# Patient Record
Sex: Female | Born: 1960 | Race: Black or African American | Hispanic: No | Marital: Single | Smoking: Former smoker
Health system: Southern US, Community
[De-identification: ages and names within clinical notes are randomized; demographics above are authoritative.]

## PROBLEM LIST (undated history)

## (undated) DIAGNOSIS — E7800 Pure hypercholesterolemia, unspecified: Secondary | ICD-10-CM

## (undated) DIAGNOSIS — E119 Type 2 diabetes mellitus without complications: Secondary | ICD-10-CM

## (undated) DIAGNOSIS — R0602 Shortness of breath: Secondary | ICD-10-CM

## (undated) DIAGNOSIS — R079 Chest pain, unspecified: Secondary | ICD-10-CM

## (undated) DIAGNOSIS — I10 Essential (primary) hypertension: Secondary | ICD-10-CM

## (undated) HISTORY — DX: Shortness of breath: R06.02

## (undated) HISTORY — PX: TUBAL LIGATION: SHX77

## (undated) HISTORY — DX: Chest pain, unspecified: R07.9

## (undated) HISTORY — PX: HERNIA REPAIR: SHX51

## (undated) HISTORY — PX: CORONARY ANGIOPLASTY WITH STENT PLACEMENT: SHX49

---

## 2010-09-05 DEATH — deceased

## 2017-03-24 ENCOUNTER — Encounter (HOSPITAL_BASED_OUTPATIENT_CLINIC_OR_DEPARTMENT_OTHER): Payer: Self-pay | Admitting: Emergency Medicine

## 2017-03-24 ENCOUNTER — Observation Stay (HOSPITAL_BASED_OUTPATIENT_CLINIC_OR_DEPARTMENT_OTHER)
Admission: EM | Admit: 2017-03-24 | Discharge: 2017-03-26 | Disposition: A | Discharge status: Home / Self Care | Payer: Medicaid Other | Attending: Internal Medicine | Admitting: Internal Medicine

## 2017-03-24 ENCOUNTER — Emergency Department (HOSPITAL_BASED_OUTPATIENT_CLINIC_OR_DEPARTMENT_OTHER): Payer: Medicaid Other

## 2017-03-24 DIAGNOSIS — I10 Essential (primary) hypertension: Secondary | ICD-10-CM | POA: Diagnosis not present

## 2017-03-24 DIAGNOSIS — E7800 Pure hypercholesterolemia, unspecified: Secondary | ICD-10-CM | POA: Diagnosis not present

## 2017-03-24 DIAGNOSIS — Z79899 Other long term (current) drug therapy: Secondary | ICD-10-CM | POA: Diagnosis not present

## 2017-03-24 DIAGNOSIS — Z7902 Long term (current) use of antithrombotics/antiplatelets: Secondary | ICD-10-CM | POA: Diagnosis not present

## 2017-03-24 DIAGNOSIS — I2510 Atherosclerotic heart disease of native coronary artery without angina pectoris: Secondary | ICD-10-CM | POA: Diagnosis not present

## 2017-03-24 DIAGNOSIS — Z8249 Family history of ischemic heart disease and other diseases of the circulatory system: Secondary | ICD-10-CM | POA: Diagnosis not present

## 2017-03-24 DIAGNOSIS — I252 Old myocardial infarction: Secondary | ICD-10-CM | POA: Diagnosis not present

## 2017-03-24 DIAGNOSIS — Z7984 Long term (current) use of oral hypoglycemic drugs: Secondary | ICD-10-CM | POA: Insufficient documentation

## 2017-03-24 DIAGNOSIS — Z9861 Coronary angioplasty status: Secondary | ICD-10-CM

## 2017-03-24 DIAGNOSIS — I200 Unstable angina: Secondary | ICD-10-CM

## 2017-03-24 DIAGNOSIS — Z6841 Body Mass Index (BMI) 40.0 and over, adult: Secondary | ICD-10-CM | POA: Insufficient documentation

## 2017-03-24 DIAGNOSIS — E785 Hyperlipidemia, unspecified: Secondary | ICD-10-CM | POA: Diagnosis present

## 2017-03-24 DIAGNOSIS — R079 Chest pain, unspecified: Secondary | ICD-10-CM | POA: Diagnosis not present

## 2017-03-24 DIAGNOSIS — Z880 Allergy status to penicillin: Secondary | ICD-10-CM | POA: Insufficient documentation

## 2017-03-24 DIAGNOSIS — R911 Solitary pulmonary nodule: Secondary | ICD-10-CM | POA: Diagnosis not present

## 2017-03-24 DIAGNOSIS — Z87891 Personal history of nicotine dependence: Secondary | ICD-10-CM | POA: Diagnosis not present

## 2017-03-24 DIAGNOSIS — Z955 Presence of coronary angioplasty implant and graft: Secondary | ICD-10-CM | POA: Insufficient documentation

## 2017-03-24 DIAGNOSIS — E119 Type 2 diabetes mellitus without complications: Secondary | ICD-10-CM

## 2017-03-24 DIAGNOSIS — K219 Gastro-esophageal reflux disease without esophagitis: Secondary | ICD-10-CM

## 2017-03-24 DIAGNOSIS — Z833 Family history of diabetes mellitus: Secondary | ICD-10-CM | POA: Insufficient documentation

## 2017-03-24 HISTORY — DX: Type 2 diabetes mellitus without complications: E11.9

## 2017-03-24 HISTORY — DX: Essential (primary) hypertension: I10

## 2017-03-24 HISTORY — DX: Pure hypercholesterolemia, unspecified: E78.00

## 2017-03-24 LAB — CBC
HCT: 27.6 % — ABNORMAL LOW (ref 36.0–46.0)
Hemoglobin: 9.3 g/dL — ABNORMAL LOW (ref 12.0–15.0)
MCH: 28.1 pg (ref 26.0–34.0)
MCHC: 33.7 g/dL (ref 30.0–36.0)
MCV: 83.4 fL (ref 78.0–100.0)
Platelets: 289 10*3/uL (ref 150–400)
RBC: 3.31 MIL/uL — ABNORMAL LOW (ref 3.87–5.11)
RDW: 15 % (ref 11.5–15.5)
WBC: 6.2 10*3/uL (ref 4.0–10.5)

## 2017-03-24 LAB — BASIC METABOLIC PANEL
Anion gap: 7 (ref 5–15)
BUN: 27 mg/dL — ABNORMAL HIGH (ref 6–20)
CO2: 25 mmol/L (ref 22–32)
Calcium: 9.1 mg/dL (ref 8.9–10.3)
Chloride: 106 mmol/L (ref 101–111)
Creatinine, Ser: 1.44 mg/dL — ABNORMAL HIGH (ref 0.44–1.00)
GFR calc Af Amer: 46 mL/min — ABNORMAL LOW (ref >60)
GFR calc non Af Amer: 40 mL/min — ABNORMAL LOW (ref >60)
Glucose, Bld: 81 mg/dL (ref 65–99)
Potassium: 3.7 mmol/L (ref 3.5–5.1)
Sodium: 138 mmol/L (ref 135–145)

## 2017-03-24 LAB — TROPONIN I: Troponin I: <0.03 ng/mL (ref <0.03)

## 2017-03-24 LAB — GLUCOSE, CAPILLARY: Glucose-Capillary: 137 mg/dL — ABNORMAL HIGH (ref 65–99)

## 2017-03-24 LAB — BRAIN NATRIURETIC PEPTIDE: B Natriuretic Peptide: 27.6 pg/mL (ref 0.0–100.0)

## 2017-03-24 MED ORDER — ONDANSETRON HCL 4 MG/2ML IJ SOLN
4.0000 mg | Freq: Four times a day (QID) | INTRAMUSCULAR | Status: DC | PRN
  Administered 2017-03-26: 4 mg via INTRAVENOUS
  Filled 2017-03-24: qty 2

## 2017-03-24 MED ORDER — AMLODIPINE BESYLATE 10 MG PO TABS
10.0000 mg | ORAL_TABLET | Freq: Every day | ORAL | Status: DC
  Administered 2017-03-25 – 2017-03-26 (×2): 10 mg via ORAL
  Filled 2017-03-24 (×2): qty 1

## 2017-03-24 MED ORDER — FAMOTIDINE 20 MG PO TABS
20.0000 mg | ORAL_TABLET | Freq: Two times a day (BID) | ORAL | Status: DC
  Administered 2017-03-25 – 2017-03-26 (×4): 20 mg via ORAL
  Filled 2017-03-24 (×4): qty 1

## 2017-03-24 MED ORDER — SIMVASTATIN 10 MG PO TABS
10.0000 mg | ORAL_TABLET | Freq: Every day | ORAL | Status: DC
  Administered 2017-03-25: 10 mg via ORAL
  Filled 2017-03-24 (×2): qty 1

## 2017-03-24 MED ORDER — ENOXAPARIN SODIUM 40 MG/0.4ML ~~LOC~~ SOLN
40.0000 mg | Freq: Every day | SUBCUTANEOUS | Status: DC
  Filled 2017-03-24: qty 0.4

## 2017-03-24 MED ORDER — INSULIN ASPART 100 UNIT/ML ~~LOC~~ SOLN
0.0000 [IU] | SUBCUTANEOUS | Status: DC
  Administered 2017-03-25: 1 [IU] via SUBCUTANEOUS

## 2017-03-24 MED ORDER — ENALAPRIL-HYDROCHLOROTHIAZIDE 10-25 MG PO TABS: 1.0000 | ORAL_TABLET | Freq: Every day | ORAL | Status: DC

## 2017-03-24 MED ORDER — ASPIRIN 81 MG PO CHEW
324.0000 mg | CHEWABLE_TABLET | Freq: Once | ORAL | Status: AC
  Administered 2017-03-24: 324 mg via ORAL
  Filled 2017-03-24: qty 4

## 2017-03-24 MED ORDER — CLOPIDOGREL BISULFATE 75 MG PO TABS
75.0000 mg | ORAL_TABLET | Freq: Every day | ORAL | Status: DC
  Administered 2017-03-25 – 2017-03-26 (×2): 75 mg via ORAL
  Filled 2017-03-24 (×2): qty 1

## 2017-03-24 MED ORDER — ACETAMINOPHEN 325 MG PO TABS
650.0000 mg | ORAL_TABLET | ORAL | Status: DC | PRN
  Administered 2017-03-25 (×2): 650 mg via ORAL
  Filled 2017-03-24 (×2): qty 2

## 2017-03-24 MED ORDER — NITROGLYCERIN 2 % TD OINT
1.0000 [in_us] | TOPICAL_OINTMENT | Freq: Once | TRANSDERMAL | Status: AC
  Administered 2017-03-24: 1 [in_us] via TOPICAL
  Filled 2017-03-24: qty 1

## 2017-03-24 NOTE — ED Provider Notes (Signed)
[facility] Provider Note   CSN: [id number] Arrival date & time: 03/24/17  1537     History   Chief Complaint Chief Complaint  Patient presents with  . Chest Pain    HPI Ruth Ortega is a 56 y.o. female.  HPI 56 year old female with a history of hypertension, hyperlipidemia, diabetes, prior MI status post stenting who presents to the emergency department for substernal, sharp chest pain that is exertional in nature, radiating to the left arm and associated with shortness of breath, and in nausea. This pain is similar to patient's prior heart attack. Has not tried taking nitroglycerin. However her patient has slowly improved since arriving to the ED. She reports having a respiratory infection about a week ago. However that has improved. Also endorses several months of right lateral lower extremity edema. Also endorsing dyspnea on exertion. Denies any prior history of DVT/PE, prolonged immobility, hormone replacement therapy.  No other alleviating or aggravating factors. Denies any other physical complaints.  Past Medical History:  Diagnosis Date  . Diabetes mellitus without complication (HCC)   . High cholesterol   . Hypertension     There are no active problems to display for this patient.   Past Surgical History:  Procedure Laterality Date  . CORONARY ANGIOPLASTY WITH STENT PLACEMENT    . HERNIA REPAIR    . TUBAL LIGATION      OB History    No data available       Home Medications    Prior to Admission medications   Medication Sig Start Date End Date Taking? Authorizing Provider  amLODipine (NORVASC) 10 MG tablet Take 10 mg by mouth daily.   Yes [provider]  clopidogrel (PLAVIX) 75 MG tablet Take 75 mg by mouth daily.   Yes [provider]  enalapril-hydrochlorothiazide (VASERETIC) 10-25 MG tablet Take 1 tablet by mouth daily.   Yes [provider]  famotidine (PEPCID) 20 MG tablet Take 20 mg by mouth 2 (two) times  daily.   Yes [provider]  metFORMIN (GLUCOPHAGE) 500 MG tablet Take 500 mg by mouth 2 (two) times daily with a meal.   Yes [provider]  repaglinide (PRANDIN) 2 MG tablet Take 2 mg by mouth 3 (three) times daily before meals.   Yes [provider]  simvastatin (ZOCOR) 10 MG tablet Take 10 mg by mouth daily.   Yes [provider]    Family History No family history on file.  Social History Social History  Substance Use Topics  . Smoking status: Former Games developer  . Smokeless tobacco: Never Used  . Alcohol use No     Allergies   Fiorinal [butalbital-aspirin-caffeine]; Latex; and Penicillins   Review of Systems Review of Systems All other systems are reviewed and are negative for acute change except as noted in the HPI   Physical Exam Updated Vital Signs BP (!) 154/77   Pulse 79   Temp 98.5 F (36.9 C) (Oral)   Resp 17   Ht  (1.549 m)   Wt 71.7 kg (158 lb)   SpO2 100%   BMI 29.85 kg/m   Physical Exam  Constitutional: She is oriented to person, place, and time. She appears well-developed and well-nourished. No distress.  HENT:  Head: Normocephalic and atraumatic.  Nose: Nose normal.  Eyes: Pupils are equal, round, and reactive to light. Conjunctivae and EOM are normal. Right eye exhibits no discharge. Left eye exhibits no discharge. No scleral icterus.  Neck: Normal range  of motion. Neck supple.  Cardiovascular: Normal rate and regular rhythm.  Exam reveals no gallop and no friction rub.   No murmur heard. Pulmonary/Chest: Effort normal and breath sounds normal. No stridor. No respiratory distress. She has no rales.  Abdominal: Soft. She exhibits no distension. There is no tenderness.  Musculoskeletal: She exhibits no edema or tenderness.  2+ bilateral lower extremity edema  Neurological: She is alert and oriented to person, place, and time.  Skin: Skin is warm and dry. No rash noted. She is not diaphoretic. No erythema.    Psychiatric: She has a normal mood and affect.  Vitals reviewed.    ED Treatments / Results  Labs (all labs ordered are listed, but only abnormal results are displayed) Labs Reviewed  BASIC METABOLIC PANEL - Abnormal; Notable for the following:       Result Value   BUN 27 (*)    Creatinine, Ser 1.44 (*)    GFR calc non Af Amer 40 (*)    GFR calc Af Amer 46 (*)    All other components within normal limits  CBC - Abnormal; Notable for the following:    RBC 3.31 (*)    Hemoglobin 9.3 (*)    HCT 27.6 (*)    All other components within normal limits  TROPONIN I  BRAIN NATRIURETIC PEPTIDE    EKG  EKG Interpretation  Date/Time:  Tuesday March 24 2017 15:56:49 EDT Ventricular Rate:  69 PR Interval:    QRS Duration: 86 QT Interval:  384 QTC Calculation: 412 R Axis:   45 Text Interpretation:  Sinus rhythm Borderline prolonged PR interval NO STEMI No old tracing to compare Confirmed by Drema Pry [phone]) on 03/24/2017 3:59:55 PM       Radiology Dg Chest 2 View  Result Date: 03/24/2017 CLINICAL DATA:  Chest pain with cardiac palpitations EXAM: CHEST  2 VIEW COMPARISON:  None. FINDINGS: On the lateral view, there is a 1.4 x 1.7 cm nodular opacity anterior to the T11-12 area. This nodular opacity is not seen on the frontal view. No edema or consolidation. Heart size and pulmonary vascularity are normal. No adenopathy. There is degenerative change in the thoracic spine. IMPRESSION: 1.4 x 1.7 cm nodular opacity common lower thoracic region anterior to T11-12 seen on lateral view only. This finding warrants noncontrast enhanced chest CT to further evaluate. No edema or consolidation. Cardiac silhouette within normal limits.  No evident adenopathy. Electronically Signed   By: Bretta Bang III M.D.   On: 03/24/2017 16:21    Procedures Procedures (including critical care time)  Medications Ordered in ED Medications - No data to display   Initial Impression / Assessment  and Plan / ED Course  I have reviewed the triage vital signs and the nursing notes.  Pertinent labs & imaging results that were available during my care of the patient were reviewed by me and considered in my medical decision making (see chart for details).     Typical chest pain for patient's ACS pain. EKG nonischemic. No prior EKG for comparison. Initial troponin negative. Patient is high risk for ACS and will require admission for ACS rule out.  Low pretest probability for pulmonary embolism. Presentation is classic for aortic dissection or esophageal perforation.  Chest x-ray without evidence suggestive of pneumonia, pneumothorax, pneumomediastinum.  No abnormal contour of the mediastinum to suggest dissection. No evidence of acute injuries. Patient had incidental finding which she was made aware of.  BMP within normal limits, not consistent  with heart failure.  Case discussed with Dr. Ophelia Charter who admitted the patient for ACS rule out.   Final Clinical Impressions(s) / ED Diagnoses   Final diagnoses:  Unstable angina (HCC)      Jaxtin Raimondo, Amadeo Garnet, MD 03/24/17 2323

## 2017-03-24 NOTE — Progress Notes (Signed)
Called with request for transfer for patient from [facility].  Patient is a 55yo with h/o HTN, HLD, DM, and CAD s/p stent placement presenting to [facility] for c/o chest pain with radiation to left shoulder and arm.   She is visiting from [place], visiting her daughter.  Associated symptoms: SOB, nausea.  +exertional.  EKG unremarkable.  Troponin negative x 1.  +LE edema, some DOE, normal BNP.  CXR negative other than a lung nodule that will need outpatient follow-up.  Needs transfer for ACS r/o.  Georgana Curio, M.D.

## 2017-03-24 NOTE — ED Notes (Signed)
Pt has been given chicken parmesan frozen dinner and 2x ginger ale to drink, ok per [employer]

## 2017-03-24 NOTE — ED Notes (Addendum)
Pt reports dizziness upon ambulating, feeling like "a black wave" is coming over here. Pt given wheelchair and assisted to bathroom.

## 2017-03-24 NOTE — ED Triage Notes (Signed)
Chest pain on left side radiating in to left shoulder and arm.

## 2017-03-24 NOTE — H&P (Signed)
History and Physical    Jaina Morin QMV:[id number] DOB: 1960-08-22 DOA: 03/24/2017  PCP: Patient, No Pcp Per  Patient coming from: Home  I have personally briefly reviewed patient's old medical records in [facility]  Chief Complaint: Chest pain  HPI: Lissete Maestas is a 56 y.o. female with medical history significant of CAD s/p stent some 2-3 years ago, DM2, HTN, HLD.  Patient presents to the ED at [facility] with c/o substernal, sharp, chest pain.  Worse with exertion and better at rest.  Radiation to L arm.  Associated SOB and nausea.  Similar to prior MI.  Symptoms improved during ED stay.   ED Course: Low prestest prob of DVT/PE.  CXR neg, trop neg, EKG non-ischemic.   Review of Systems: As per HPI otherwise 10 point review of systems negative.   Past Medical History:  Diagnosis Date  . Diabetes mellitus without complication (HCC)   . High cholesterol   . Hypertension     Past Surgical History:  Procedure Laterality Date  . CORONARY ANGIOPLASTY WITH STENT PLACEMENT    . HERNIA REPAIR    . TUBAL LIGATION       reports that she has quit smoking. She has never used smokeless tobacco. She reports that she does not drink alcohol or use drugs.  Allergies  Allergen Reactions  . Fiorinal [Butalbital-Aspirin-Caffeine]   . Latex   . Penicillins     No family history on file.   Prior to Admission medications   Medication Sig Start Date End Date Taking? Authorizing Provider  amLODipine (NORVASC) 10 MG tablet Take 10 mg by mouth daily.   Yes [provider]  clopidogrel (PLAVIX) 75 MG tablet Take 75 mg by mouth daily.   Yes [provider]  enalapril-hydrochlorothiazide (VASERETIC) 10-25 MG tablet Take 1 tablet by mouth daily.   Yes [provider]  famotidine (PEPCID) 20 MG tablet Take 20 mg by mouth 2 (two) times daily.   Yes [provider]  metFORMIN (GLUCOPHAGE) 500 MG tablet Take 500 mg by mouth 2 (two) times daily with a meal.   Yes  [provider]  repaglinide (PRANDIN) 2 MG tablet Take 2 mg by mouth 3 (three) times daily before meals.   Yes [provider]  simvastatin (ZOCOR) 10 MG tablet Take 10 mg by mouth daily.   Yes [provider]    Physical Exam: Vitals:   03/24/17 2100 03/24/17 2130 03/24/17 2200 03/24/17 2313  BP: (!) 146/73 137/82 (!) 143/68 (!) 152/71  Pulse: 78 78  78  Resp: 16 (!) Temp:    97.8 F (36.6 C)  TempSrc:    Oral  SpO2: 100% 100%  100%  Weight:    121.5 kg (267 lb 14.4 oz)  Height:     (1.651 m)    Constitutional: NAD, calm, comfortable Eyes: PERRL, lids and conjunctivae normal ENMT: Mucous membranes are moist. Posterior pharynx clear of any exudate or lesions.Normal dentition.  Neck: normal, supple, no masses, no thyromegaly Respiratory: clear to auscultation bilaterally, no wheezing, no crackles. Normal respiratory effort. No accessory muscle use.  Cardiovascular: Regular rate and rhythm, no murmurs / rubs / gallops. No extremity edema. 2+ pedal pulses. No carotid bruits.  Abdomen: no tenderness, no masses palpated. No hepatosplenomegaly. Bowel sounds positive.  Musculoskeletal: no clubbing / cyanosis. No joint deformity upper and lower extremities. Good ROM, no contractures. Normal muscle tone.  Skin: no rashes, lesions, ulcers. No induration Neurologic:  CN 2-12 grossly intact. Sensation intact, DTR normal. Strength 5/5 in all 4.  Psychiatric: Normal judgment and insight. Alert and oriented x 3. Normal mood.    Labs on Admission: I have personally reviewed following labs and imaging studies  CBC:  Recent Labs Lab 03/24/17 1600  WBC 6.2  HGB 9.3*  HCT 27.6*  MCV 83.4  PLT 289   Basic Metabolic Panel:  Recent Labs Lab 03/24/17 1600  NA 138  K 3.7  CL 106  CO2 25  GLUCOSE 81  BUN 27*  CREATININE 1.44*  CALCIUM 9.1   GFR: Estimated Creatinine Clearance: 57.7 mL/min (A) (by C-G formula based on SCr of 1.44 mg/dL  (H)). Liver Function Tests: No results for input(s): AST, ALT, ALKPHOS, BILITOT, PROT, ALBUMIN in the last 168 hours. No results for input(s): LIPASE, AMYLASE in the last 168 hours. No results for input(s): AMMONIA in the last 168 hours. Coagulation Profile: No results for input(s): INR, PROTIME in the last 168 hours. Cardiac Enzymes:  Recent Labs Lab 03/24/17 1600  TROPONINI <0.03   BNP (last 3 results) No results for input(s): PROBNP in the last 8760 hours. HbA1C: No results for input(s): HGBA1C in the last 72 hours. CBG:  Recent Labs Lab 03/24/17 2328  GLUCAP 137*   Lipid Profile: No results for input(s): CHOL, HDL, LDLCALC, TRIG, CHOLHDL, LDLDIRECT in the last 72 hours. Thyroid Function Tests: No results for input(s): TSH, T4TOTAL, FREET4, T3FREE, THYROIDAB in the last 72 hours. Anemia Panel: No results for input(s): VITAMINB12, FOLATE, FERRITIN, TIBC, IRON, RETICCTPCT in the last 72 hours. Urine analysis: No results found for: COLORURINE, APPEARANCEUR, LABSPEC, PHURINE, GLUCOSEU, HGBUR, BILIRUBINUR, KETONESUR, PROTEINUR, UROBILINOGEN, NITRITE, LEUKOCYTESUR  Radiological Exams on Admission: Dg Chest 2 View  Result Date: 03/24/2017 CLINICAL DATA:  Chest pain with cardiac palpitations EXAM: CHEST  2 VIEW COMPARISON:  None. FINDINGS: On the lateral view, there is a 1.4 x 1.7 cm nodular opacity anterior to the T11-12 area. This nodular opacity is not seen on the frontal view. No edema or consolidation. Heart size and pulmonary vascularity are normal. No adenopathy. There is degenerative change in the thoracic spine. IMPRESSION: 1.4 x 1.7 cm nodular opacity common lower thoracic region anterior to T11-12 seen on lateral view only. This finding warrants noncontrast enhanced chest CT to further evaluate. No edema or consolidation. Cardiac silhouette within normal limits.  No evident adenopathy. Electronically Signed   By: Bretta Bang III M.D.   On: 03/24/2017 16:21    EKG:  Independently reviewed.  Assessment/Plan Principal Problem:   Chest pain Active Problems:   DM2 (diabetes mellitus, type 2) (HCC)   HTN (hypertension)    1. CP rule out - 1. CP obs pathway 2. Serial trops 3. NPO after midnight 4. Cards eval in AM 5. Tele monitor 2. DM2 - 1. Holding home PO hypoglycemics 2. Sensitive SSI Q4H 3. HTN - 1. Continue home BP meds  DVT prophylaxis: Lovenox Code Status: Full Family Communication: No family in room Disposition Plan: Home after admit Consults called: Left message for P. Trent for routine cards consult in AM Admission status: Place in [place], [place]. DO [facility] Pager [phone]  If 7AM-7PM, please contact day team taking care of patient [email or web address] Password [facility]  03/24/2017, 11:48 PM

## 2017-03-24 NOTE — ED Notes (Signed)
Pt resting comfortably on the stretcher in NAD. Pt not having any active chest pain at this time.

## 2017-03-25 ENCOUNTER — Encounter (HOSPITAL_COMMUNITY): Payer: Self-pay | Admitting: Cardiology

## 2017-03-25 ENCOUNTER — Observation Stay (HOSPITAL_BASED_OUTPATIENT_CLINIC_OR_DEPARTMENT_OTHER): Payer: Medicaid Other

## 2017-03-25 DIAGNOSIS — R079 Chest pain, unspecified: Secondary | ICD-10-CM

## 2017-03-25 DIAGNOSIS — I2510 Atherosclerotic heart disease of native coronary artery without angina pectoris: Secondary | ICD-10-CM | POA: Diagnosis not present

## 2017-03-25 DIAGNOSIS — E785 Hyperlipidemia, unspecified: Secondary | ICD-10-CM

## 2017-03-25 DIAGNOSIS — K219 Gastro-esophageal reflux disease without esophagitis: Secondary | ICD-10-CM

## 2017-03-25 DIAGNOSIS — Z9861 Coronary angioplasty status: Secondary | ICD-10-CM

## 2017-03-25 DIAGNOSIS — E119 Type 2 diabetes mellitus without complications: Secondary | ICD-10-CM | POA: Diagnosis not present

## 2017-03-25 DIAGNOSIS — I10 Essential (primary) hypertension: Secondary | ICD-10-CM | POA: Diagnosis not present

## 2017-03-25 DIAGNOSIS — I252 Old myocardial infarction: Secondary | ICD-10-CM | POA: Diagnosis not present

## 2017-03-25 LAB — GLUCOSE, CAPILLARY
Glucose-Capillary: 149 mg/dL — ABNORMAL HIGH (ref 65–99)
Glucose-Capillary: 129 mg/dL — ABNORMAL HIGH (ref 65–99)
Glucose-Capillary: 206 mg/dL — ABNORMAL HIGH (ref 65–99)
Glucose-Capillary: 146 mg/dL — ABNORMAL HIGH (ref 65–99)

## 2017-03-25 LAB — HEMOGLOBIN A1C
Hgb A1c MFr Bld: 6 % — ABNORMAL HIGH (ref 4.8–5.6)
Mean Plasma Glucose: 125.5 mg/dL

## 2017-03-25 LAB — TROPONIN I
Troponin I: <0.03 ng/mL (ref <0.03)
Troponin I: <0.03 ng/mL (ref <0.03)
Troponin I: <0.03 ng/mL (ref <0.03)

## 2017-03-25 LAB — HIV ANTIBODY (ROUTINE TESTING W REFLEX): HIV Screen 4th Generation wRfx: NONREACTIVE

## 2017-03-25 MED ORDER — ENALAPRIL MALEATE 10 MG PO TABS
10.0000 mg | ORAL_TABLET | Freq: Every day | ORAL | Status: DC
  Administered 2017-03-25 – 2017-03-26 (×2): 10 mg via ORAL
  Filled 2017-03-25 (×2): qty 1

## 2017-03-25 MED ORDER — HYDROCHLOROTHIAZIDE 25 MG PO TABS
25.0000 mg | ORAL_TABLET | Freq: Every day | ORAL | Status: DC
  Administered 2017-03-25 – 2017-03-26 (×2): 25 mg via ORAL
  Filled 2017-03-25 (×2): qty 1

## 2017-03-25 MED ORDER — REGADENOSON 0.4 MG/5ML IV SOLN
INTRAVENOUS | Status: AC
  Filled 2017-03-25: qty 5

## 2017-03-25 MED ORDER — REGADENOSON 0.4 MG/5ML IV SOLN
0.4000 mg | Freq: Once | INTRAVENOUS | Status: AC
Start: 2017-03-25 — End: 2017-03-25
  Administered 2017-03-25: 0.4 mg via INTRAVENOUS
  Filled 2017-03-25: qty 5

## 2017-03-25 MED ORDER — TECHNETIUM TC 99M TETROFOSMIN IV KIT
30.0000 | PACK | Freq: Once | INTRAVENOUS | Status: AC | PRN
  Administered 2017-03-25: 30 via INTRAVENOUS

## 2017-03-25 MED ORDER — INSULIN ASPART 100 UNIT/ML ~~LOC~~ SOLN
0.0000 [IU] | Freq: Three times a day (TID) | SUBCUTANEOUS | Status: DC
  Administered 2017-03-26: 1 [IU] via SUBCUTANEOUS
  Administered 2017-03-26: 2 [IU] via SUBCUTANEOUS

## 2017-03-25 NOTE — Progress Notes (Signed)
[facility] PROGRESS NOTE  Ruth Ortega [id number] FournierOB: 11-14-1960 DOA: 03/24/2017 PCP: Patient, No Pcp Per  Interim summary and HPI 56 y.o. female with medical history significant of CAD s/p stent some 2-3 years ago, DM2, HTN, HLD.  Patient presents to the ED at [facility] with c/o substernal, sharp, chest pain.  Worse with exertion and better at rest.  Radiation to L arm.  Associated SOB and nausea.  Assessment/Plan: 1-chest pain -with typical and atypical features  -heart score 5 -troponin neg X 3 -case discussed with cardiology and will plan for inpatient stress test; if low probability can go home and follow up with her cardiologist; otherwise will require heart cath. -CP free currently -will continue ASA, zocor, Plavix and ACE -might benefit of stopping norvasc and starting b-blocker  2-GERD -will continue Pepcid  3-HTN -BP stable overall  -will continue current antihypertensive regimen   4-obesity -Body mass index is 44.23 kg/m. -low calorie diet and weight loss discussed with patient   5-HLD -continue statins  6-diabetes type 2 -will use SSI and hold oral hypoglycemic regimen while inpatient  -follow CBG's and adjust as needed; base on fluctuation might need lantus. -will check A1C  7-Chest nodular opacity  -1.4X1.7 cm nodular opacity common lower thoracic region anterior to T11-T12 on lateral view only. -patient with remote hx of smoking and denying second hand smoking -reported recent URI -after discussing need for CT scan; patient decided to pursuit that work up back home.  Code Status: Full Family Communication: no family at bedside  Disposition Plan: home when medically stable. Completing 2 days stress test to determine needs of further intervention    Consultants:  Cardiology   Procedures:  Nuclear stress test   Antibiotics:  None  HPI/Subjective: Afebrile, no nausea, no vomiting and not abd pain. Patient currently CP free and no  SOB.  Objective: Vitals:   03/25/17 1326 03/25/17 1635  BP: (!) 165/72 (!) 185/76  Pulse: 73 76  Resp: 17 16  Temp: 97.6 F (36.4 C) (!) 96.4 F (35.8 C)  SpO2: 100% 100%    Intake/Output Summary (Last 24 hours) at 03/25/17 1655 Last data filed at 03/25/17 1511  Gross per 24 hour  Intake              240 ml  Output             1500 ml  Net            -1260 ml   Filed Weights   03/24/17 1544 03/24/17 2313 03/25/17 0707  Weight: 71.7 kg (158 lb) 121.5 kg (267 lb 14.4 oz) 120.6 kg (265 lb 12.8 oz)    Exam:   General:  NAD, afebrile, no CP and denying SOB. No nausea, no vomiting.  Cardiovascular: S1 and s2, no rubs, no gallops  Respiratory: CTA bilaterally, no tachypnea and normal resp effort   Abdomen: soft, NT, ND, positive BS  Musculoskeletal: trace edema bilaterally, no cyanosis or clubbing    Data Reviewed: Basic Metabolic Panel:  Recent Labs Lab 03/24/17 1600  NA 138  K 3.7  CL 106  CO2 25  GLUCOSE 81  BUN 27*  CREATININE 1.44*  CALCIUM 9.1   Liver Function Tests: No results for input(s): AST, ALT, ALKPHOS, BILITOT, PROT, ALBUMIN in the last 168 hours. No results for input(s): LIPASE, AMYLASE in the last 168 hours. No results for input(s): AMMONIA in the last 168 hours. CBC:  Recent Labs Lab 03/24/17 1600  WBC 6.2  HGB 9.3*  HCT 27.6*  MCV 83.4  PLT 289   Cardiac Enzymes:  Recent Labs Lab 03/24/17 1600 03/24/17 2348 03/25/17 0217 03/25/17 0505  TROPONINI <0.03 <0.03 <0.03 <0.03   BNP (last 3 results)  Recent Labs  03/24/17 1600  BNP 27.6   CBG:  Recent Labs Lab 03/24/17 2328 03/25/17 0443 03/25/17 0741  GLUCAP 137* 149* 129*    Studies: Dg Chest 2 View  Result Date: 03/24/2017 CLINICAL DATA:  Chest pain with cardiac palpitations EXAM: CHEST  2 VIEW COMPARISON:  None. FINDINGS: On the lateral view, there is a 1.4 x 1.7 cm nodular opacity anterior to the T11-12 area. This nodular opacity is not seen on the frontal  view. No edema or consolidation. Heart size and pulmonary vascularity are normal. No adenopathy. There is degenerative change in the thoracic spine. IMPRESSION: 1.4 x 1.7 cm nodular opacity common lower thoracic region anterior to T11-12 seen on lateral view only. This finding warrants noncontrast enhanced chest CT to further evaluate. No edema or consolidation. Cardiac silhouette within normal limits.  No evident adenopathy. Electronically Signed   By: Bretta Bang III M.D.   On: 03/24/2017 16:21    Scheduled Meds: . amLODipine  10 mg Oral Daily  . clopidogrel  75 mg Oral Daily  . enalapril  10 mg Oral Daily   And  . hydrochlorothiazide  25 mg Oral Daily  . enoxaparin (LOVENOX) injection  40 mg Subcutaneous Daily  . famotidine  20 mg Oral BID  . insulin aspart  0-9 Units Subcutaneous Q4H  . simvastatin  10 mg Oral Daily   Continuous Infusions:  Principal Problem:   Chest pain with moderate risk of acute coronary syndrome Active Problems:   DM2 (diabetes mellitus, type 2) (HCC)   HTN (hypertension)   CAD S/P percutaneous coronary angioplasty   Dyslipidemia    Time spent: 30 minutes    Vassie Loll  [facility] Pager [phone]. If 7PM-7AM, please contact night-coverage at [email or web address], password [email or web address] 03/25/2017, 4:55 PM  LOS: 0 days

## 2017-03-25 NOTE — Progress Notes (Signed)
Stress Lexiscan done today. Final report pending- she is a two day study.  Corine Shelter PA-C 03/25/2017 12:13 PM

## 2017-03-25 NOTE — Progress Notes (Signed)
Paged MD regarding pt return from stress test, informed of results and requesting diet order

## 2017-03-25 NOTE — Progress Notes (Signed)
Pt states she is having "flutters" Pt states she informed MD this is how she felt on admission  Pt denies pain, and denies needing nitro Vital signs taken, EKG completed and placed in chart and placed pt on nasal canula Pt states she feels better  Pt transported to stress test

## 2017-03-25 NOTE — Progress Notes (Signed)
MD at bedside, stated okay for pt to have sips of water with all morning medications

## 2017-03-25 NOTE — Consult Note (Signed)
Cardiology Consultation:   Patient ID: Ruth Ortega; [id number]; 1960/10/26   Admit date: 03/24/2017 Date of Consult: 03/25/2017  Primary Care Provider: Patient, No Pcp Per Primary Cardiologist: primary cardiologist in [place]   Patient Profile:   Ruth Ortega is a 56 y.o. female with a hx of CAD s/p PCI in [place] who is being seen today for the evaluation of chest pain at the request of Dr Gwenlyn Perking.   History of Present Illness:   Ruth Ortega is a pleasant single 55 y/o female with a history of CAD-s/p PCI in [place] 3 years ago in the setting of "a small heart attack".  She had some recurrent chest pain after this and had what sounds like a chemical stress test a year ago that was OK as far as she knows. Apparently her symptoms were determined to be GI.   She is her helping her daughter with the children after her daughter had surgery. Yesterday she developed exertional weakness "things were going black", palpitations, as well as Lt sided chest pain, Lt arm pain, diaphoresis, and nausea. She was taken to the ED and admitted. Her EKG shows no acute changes and her Troponin are negative. She has had no further chest pain. Telemetry has been stable.  Past Medical History:  Diagnosis Date  . Diabetes mellitus without complication (HCC)   . High cholesterol   . Hypertension     Past Surgical History:  Procedure Laterality Date  . CORONARY ANGIOPLASTY WITH STENT PLACEMENT    . HERNIA REPAIR    . TUBAL LIGATION       Home Medications:  Prior to Admission medications   Medication Sig Start Date End Date Taking? Authorizing Provider  amLODipine (NORVASC) 10 MG tablet Take 10 mg by mouth daily.   Yes [provider]  clopidogrel (PLAVIX) 75 MG tablet Take 75 mg by mouth daily.   Yes [provider]  enalapril-hydrochlorothiazide (VASERETIC) 10-25 MG tablet Take 1 tablet by mouth daily.   Yes [provider]  famotidine (PEPCID) 20 MG tablet Take 20 mg by mouth 2 (two) times  daily.   Yes [provider]  metFORMIN (GLUCOPHAGE) 500 MG tablet Take 500 mg by mouth 2 (two) times daily with a meal.   Yes [provider]  repaglinide (PRANDIN) 2 MG tablet Take 2 mg by mouth 3 (three) times daily before meals.   Yes [provider]  simvastatin (ZOCOR) 10 MG tablet Take 10 mg by mouth daily.   Yes [provider]    Inpatient Medications: Scheduled Meds: . amLODipine  10 mg Oral Daily  . clopidogrel  75 mg Oral Daily  . enalapril-hydrochlorothiazide  1 tablet Oral Daily  . enoxaparin (LOVENOX) injection  40 mg Subcutaneous Daily  . famotidine  20 mg Oral BID  . insulin aspart  0-9 Units Subcutaneous Q4H  . simvastatin  10 mg Oral Daily   Continuous Infusions:  PRN Meds: acetaminophen, ondansetron (ZOFRAN) IV  Allergies:    Allergies  Allergen Reactions  . Fiorinal [Butalbital-Aspirin-Caffeine]   . Latex   . Penicillins     Social History:   Social History   Social History  . Marital status: Single    Spouse name: N/A  . Number of children: N/A  . Years of education: N/A   Occupational History  . Not on file.   Social History Main Topics  . Smoking status: Former Games developer  . Smokeless tobacco: Never Used  . Alcohol use No  .  Drug use: No  . Sexual activity: Not on file   Other Topics Concern  . Not on file   Social History Narrative  . No narrative on file    Family History:   M-F-Sister-DM and CAD  ROS:  Please see the history of present illness.  ROS  Transient visual defect OS in June that spontaneously resolved. She was evaluated by an opthalmologist and no obvious source found. Carpal tunnel Lt wrist All other ROS reviewed and negative.     Physical Exam/Data:   Vitals:   03/24/17 2200 03/24/17 2313 03/25/17 0707 03/25/17 0827  BP: (!) 143/68 (!) 152/71  (!) 149/72  Pulse:  78  74  Resp: Temp:  97.8 F (36.6 C)  98.2 F (36.8 C)  TempSrc:  Oral  Oral  SpO2:  100%  99%    Weight:  267 lb 14.4 oz (121.5 kg) 265 lb 12.8 oz (120.6 kg)   Height:   (1.651 m)      Intake/Output Summary (Last 24 hours) at 03/25/17 1041 Last data filed at 03/25/17 0645  Gross per 24 hour  Intake              240 ml  Output              500 ml  Net             -260 ml   Filed Weights   03/24/17 1544 03/24/17 2313 03/25/17 0707  Weight: 158 lb (71.7 kg) 267 lb 14.4 oz (121.5 kg) 265 lb 12.8 oz (120.6 kg)   Body mass index is 44.23 kg/m.  General:  Obese AA female, NAD HEENT: normal Lymph: no adenopathy Neck: no JVD, no carotid bruit Endocrine:  No thryomegaly Vascular: No carotid bruits; FA pulses 2+ bilaterally without bruits  Cardiac:  normal S1, S2; RRR; no murmur  Lungs:  clear to auscultation bilaterally, no wheezing, rhonchi or rales  Abd: soft, nontender, no hepatomegaly  Ext: no edema Musculoskeletal:  No deformities, BUE and BLE strength normal and equal Skin: warm and dry  Neuro:  CNs 2-12 intact, no focal abnormalities noted Psych:  Normal affect   EKG:  The EKG was personally reviewed and demonstrates:  NSR Telemetry:  Telemetry was personally reviewed and demonstrates:  NSR   Laboratory Data:  Chemistry  Recent Labs Lab 03/24/17 1600  NA 138  K 3.7  CL 106  CO2 25  GLUCOSE 81  BUN 27*  CREATININE 1.44*  CALCIUM 9.1  GFRNONAA 40*  GFRAA 46*  ANIONGAP 7    No results for input(s): PROT, ALBUMIN, AST, ALT, ALKPHOS, BILITOT in the last 168 hours. Hematology  Recent Labs Lab 03/24/17 1600  WBC 6.2  RBC 3.31*  HGB 9.3*  HCT 27.6*  MCV 83.4  MCH 28.1  MCHC 33.7  RDW 15.0  PLT 289   Cardiac Enzymes  Recent Labs Lab 03/24/17 1600 03/24/17 2348 03/25/17 0217 03/25/17 0505  TROPONINI <0.03 <0.03 <0.03 <0.03   No results for input(s): TROPIPOC in the last 168 hours.  BNP  Recent Labs Lab 03/24/17 1600  BNP 27.6    DDimer No results for input(s): DDIMER in the last 168 hours.  Radiology/Studies:  Dg Chest 2  View  Result Date: 03/24/2017 CLINICAL DATA:  Chest pain with cardiac palpitations EXAM: CHEST  2 VIEW COMPARISON:  None. FINDINGS: On the lateral view, there is a 1.4 x 1.7 cm nodular opacity anterior to  the T11-12 area. This nodular opacity is not seen on the frontal view. No edema or consolidation. Heart size and pulmonary vascularity are normal. No adenopathy. There is degenerative change in the thoracic spine. IMPRESSION: 1.4 x 1.7 cm nodular opacity common lower thoracic region anterior to T11-12 seen on lateral view only. This finding warrants noncontrast enhanced chest CT to further evaluate. No edema or consolidation. Cardiac silhouette within normal limits.  No evident adenopathy. Electronically Signed   By: Bretta Bang III M.D.   On: 03/24/2017 16:21    Assessment and Plan:   1. Chest pain with a moderate risk for cardiac etiology 2. NIDDM 3. HTN 4. HLD  Plan: Will proceed with Lexiscan today-further recommendations pending  For questions or updates, please contact [facility] Please consult [email or web address] for contact info under Cardiology/STEMI.   Jolene Provost, PA-C  03/25/2017 10:41 AM

## 2017-03-26 ENCOUNTER — Other Ambulatory Visit (HOSPITAL_COMMUNITY): Payer: Medicaid Other

## 2017-03-26 DIAGNOSIS — Z9861 Coronary angioplasty status: Secondary | ICD-10-CM

## 2017-03-26 DIAGNOSIS — I2510 Atherosclerotic heart disease of native coronary artery without angina pectoris: Secondary | ICD-10-CM

## 2017-03-26 DIAGNOSIS — K219 Gastro-esophageal reflux disease without esophagitis: Secondary | ICD-10-CM

## 2017-03-26 DIAGNOSIS — E119 Type 2 diabetes mellitus without complications: Secondary | ICD-10-CM | POA: Diagnosis not present

## 2017-03-26 DIAGNOSIS — E785 Hyperlipidemia, unspecified: Secondary | ICD-10-CM | POA: Diagnosis not present

## 2017-03-26 DIAGNOSIS — R079 Chest pain, unspecified: Secondary | ICD-10-CM | POA: Diagnosis not present

## 2017-03-26 LAB — GLUCOSE, CAPILLARY
Glucose-Capillary: 136 mg/dL — ABNORMAL HIGH (ref 65–99)
Glucose-Capillary: 128 mg/dL — ABNORMAL HIGH (ref 65–99)
Glucose-Capillary: 197 mg/dL — ABNORMAL HIGH (ref 65–99)

## 2017-03-26 LAB — CBC
HCT: 29.1 % — ABNORMAL LOW (ref 36.0–46.0)
Hemoglobin: 9.6 g/dL — ABNORMAL LOW (ref 12.0–15.0)
MCH: 27.3 pg (ref 26.0–34.0)
MCHC: 33 g/dL (ref 30.0–36.0)
MCV: 82.7 fL (ref 78.0–100.0)
Platelets: 273 10*3/uL (ref 150–400)
RBC: 3.52 MIL/uL — ABNORMAL LOW (ref 3.87–5.11)
RDW: 15.1 % (ref 11.5–15.5)
WBC: 5.2 10*3/uL (ref 4.0–10.5)

## 2017-03-26 LAB — BASIC METABOLIC PANEL
Anion gap: 8 (ref 5–15)
BUN: 22 mg/dL — ABNORMAL HIGH (ref 6–20)
CO2: 25 mmol/L (ref 22–32)
Calcium: 9.3 mg/dL (ref 8.9–10.3)
Chloride: 102 mmol/L (ref 101–111)
Creatinine, Ser: 1.51 mg/dL — ABNORMAL HIGH (ref 0.44–1.00)
GFR calc Af Amer: 44 mL/min — ABNORMAL LOW (ref >60)
GFR calc non Af Amer: 38 mL/min — ABNORMAL LOW (ref >60)
Glucose, Bld: 156 mg/dL — ABNORMAL HIGH (ref 65–99)
Potassium: 3.8 mmol/L (ref 3.5–5.1)
Sodium: 135 mmol/L (ref 135–145)

## 2017-03-26 LAB — NM MYOCAR MULTI W/SPECT W/WALL MOTION / EF
Exercise duration (min): 4 min
Rest HR: 64 {beats}/min

## 2017-03-26 MED ORDER — PANTOPRAZOLE SODIUM 40 MG PO TBEC: 40.0000 mg | DELAYED_RELEASE_TABLET | Freq: Every day | ORAL | 1 refills | Status: DC

## 2017-03-26 MED ORDER — ENALAPRIL-HYDROCHLOROTHIAZIDE 10-25 MG PO TABS: 1.0000 | ORAL_TABLET | Freq: Every day | ORAL | 1 refills | Status: DC

## 2017-03-26 MED ORDER — CARVEDILOL 25 MG PO TABS: 25.0000 mg | ORAL_TABLET | Freq: Two times a day (BID) | ORAL | 1 refills | Status: DC

## 2017-03-26 MED ORDER — TECHNETIUM TC 99M TETROFOSMIN IV KIT
30.0000 | PACK | Freq: Once | INTRAVENOUS | Status: AC | PRN
  Administered 2017-03-26: 30 via INTRAVENOUS

## 2017-03-26 NOTE — Discharge Summary (Signed)
Physician Discharge Summary  Ruth Ortega WUJ:[id number] DOB: 23-Apr-1961 DOA: 03/24/2017  PCP: Patient, No Pcp Per  Admit date: 03/24/2017 Discharge date: 03/26/2017  Time spent: 35 minutes  Recommendations for Outpatient Follow-up:  1. Reassess BP and adjust medications as needed . 2. Please check CT scan of chest in 3-6 months to assess abnormal nodularity seen on x-ray. 3. Repeat BMET to follow electrolytes and renal function    Discharge Diagnoses:  Principal Problem:   Chest pain with moderate risk of acute coronary syndrome Active Problems:   DM2 (diabetes mellitus, type 2) (HCC)   HTN (hypertension)   CAD S/P percutaneous coronary angioplasty   Dyslipidemia   Gastroesophageal reflux disease   Obesity, Class III, BMI 40-49.9 (morbid obesity) (HCC)   Discharge Condition: stable and improved. Discharge home with instructions to follow up with PCP and her cardiologist after discharge (patient originally from [place])  Diet recommendation: heart healthy diet, modified carbohydrates and low calorie diet.  Filed Weights   03/25/17 0707 03/25/17 2011 03/26/17 0513  Weight: 120.6 kg (265 lb 12.8 oz) 123.5 kg (272 lb 4.3 oz) 119.5 kg (263 lb 6.4 oz)    History of present illness:  56 y.o.femalewith medical history significant of CAD s/p stent some 2-3 years ago, DM2, HTN, HLD. Patient presents to the ED at [facility] with c/o substernal, sharp, chest pain. Worse with exertion and better at rest. Radiation to L arm. Associated SOB and nausea  Hospital Course:  1-chest pain -with typical and atypical features  -heart score 5 -troponin neg X 3 -case discussed with cardiology; patient had nuclear stress test with low risk probability results. -no CP or SOB at dischargea -will continue zocor, Coreg, Plavix and ACE -will follow up with primary cardiologist in [place]  2-GERD -will continue Pepcid and discharge on protonix   3-HTN -BP stable overall  -will continue vasotec, HCTZ and  carvedilol (dose adjusted to 25 mg BID)  -norvasc discontinued -patient advise to follow low sodium/heart healthy diet.  4-obesity -Body mass index is 44.23 kg/m. -low calorie diet and weight loss discussed with patient   5-HLD -will continue continue statins  6-diabetes type 2 -will need close follow up of her A1C and CBG's -resume home hypoglycemic regimen -patient instructed to follow modified carbohydrates diet   7-Chest nodular opacity  -1.4X1.7 cm nodular opacity common lower thoracic region anterior to T11-T12 on lateral view only. -patient with remote hx of smoking and denying second hand smoking -reported recent URI -after discussing need for CT scan; patient decided to pursuit that work up back home.  Procedures:  See below for x-ray report   Nuclear stress test: low risk probability   Consultations:  Cardiology    Discharge Exam: Vitals:   03/26/17 0856 03/26/17 1343  BP: (!) 154/73 139/81  Pulse:  75  Resp:    Temp:  98.2 F (36.8 C)  SpO2:  100%    General:  NAD, afebrile, no CP and denying SOB. No nausea, no vomiting.  Cardiovascular: S1 and s2, no rubs, no gallops  Respiratory: CTA bilaterally, no tachypnea and normal resp effort   Abdomen: soft, NT, ND, positive BS  Musculoskeletal: trace edema bilaterally, no cyanosis or clubbing     Discharge Instructions   Discharge Instructions    Discharge instructions    Complete by:  As directed    Take medications as prescribed  Please arrange follow up with PCP in 2 weeks Arrange follow up with your cardiologist after discharge  Keep  yourself well hydrated  Follow heart healthy, modified carbohydrates and low calorie diet     Current Discharge Medication List    START taking these medications   Details  pantoprazole (PROTONIX) 40 MG tablet Take 1 tablet (40 mg total) by mouth daily. Qty: 30 tablet, Refills: 1      CONTINUE these medications which have CHANGED   Details   carvedilol (COREG) 25 MG tablet Take 1 tablet (25 mg total) by mouth 2 (two) times daily. Qty: 60 tablet, Refills: 1    enalapril-hydrochlorothiazide (VASERETIC) 10-25 MG tablet Take 1 tablet by mouth daily. Qty: 30 tablet, Refills: 1      CONTINUE these medications which have NOT CHANGED   Details  clopidogrel (PLAVIX) 75 MG tablet Take 75 mg by mouth daily.    famotidine (PEPCID) 20 MG tablet Take 20 mg by mouth 2 (two) times daily.    metFORMIN (GLUCOPHAGE) 500 MG tablet Take 500 mg by mouth daily as needed.     repaglinide (PRANDIN) 2 MG tablet Take 2 mg by mouth 3 (three) times daily before meals.    simvastatin (ZOCOR) 10 MG tablet Take 10 mg by mouth daily.      STOP taking these medications     amLODipine (NORVASC) 10 MG tablet        Allergies  Allergen Reactions  . Fiorinal [Butalbital-Aspirin-Caffeine] Other (See Comments)    Hallucination  . Latex   . Penicillins Hives and Nausea And Vomiting    Has patient had a PCN reaction causing immediate rash, facial/tongue/throat swelling, SOB or lightheadedness with hypotension: No Has patient had a PCN reaction causing severe rash involving mucus membranes or skin necrosis: No Has patient had a PCN reaction that required hospitalization: No Has patient had a PCN reaction occurring within the last 10 years: Yes If all of the above answers are "NO", then may proceed with Cephalosporin use.      The results of significant diagnostics from this hospitalization (including imaging, microbiology, ancillary and laboratory) are listed below for reference.    Significant Diagnostic Studies: Dg Chest 2 View  Result Date: 03/24/2017 CLINICAL DATA:  Chest pain with cardiac palpitations EXAM: CHEST  2 VIEW COMPARISON:  None. FINDINGS: On the lateral view, there is a 1.4 x 1.7 cm nodular opacity anterior to the T11-12 area. This nodular opacity is not seen on the frontal view. No edema or consolidation. Heart size and pulmonary  vascularity are normal. No adenopathy. There is degenerative change in the thoracic spine. IMPRESSION: 1.4 x 1.7 cm nodular opacity common lower thoracic region anterior to T11-12 seen on lateral view only. This finding warrants noncontrast enhanced chest CT to further evaluate. No edema or consolidation. Cardiac silhouette within normal limits.  No evident adenopathy. Electronically Signed   By: Bretta Bang III M.D.   On: 03/24/2017 16:21   Nm Myocar Multi W/spect W/wall Motion / Ef  Result Date: 03/26/2017  There was no ST segment deviation noted during stress.  No T wave inversion was noted during stress.  This is a low risk study.  The left ventricular ejection fraction is normal (55-65%).  Nuclear stress EF: 59%.  1. Fixed, medium-sized, mild basal to mid anterior perfusion defect.  Given normal wall motion, this may represent soft tissue attenuation.  No definite ischemia. 2. EF 59% with normal wall motion. Low risk study.   Labs: Basic Metabolic Panel:  Recent Labs Lab 03/24/17 1600 03/26/17 1010  NA 138 135  K 3.7  3.8  CL 106 102  CO2 25 25  GLUCOSE 81 156*  BUN 27* 22*  CREATININE 1.44* 1.51*  CALCIUM 9.1 9.3   CBC:  Recent Labs Lab 03/24/17 1600 03/26/17 1010  WBC 6.2 5.2  HGB 9.3* 9.6*  HCT 27.6* 29.1*  MCV 83.4 82.7  PLT 289 273   Cardiac Enzymes:  Recent Labs Lab 03/24/17 1600 03/24/17 2348 03/25/17 0217 03/25/17 0505  TROPONINI <0.03 <0.03 <0.03 <0.03   BNP: BNP (last 3 results)  Recent Labs  03/24/17 1600  BNP 27.6    CBG:  Recent Labs Lab 03/25/17 1716 03/25/17 2007 03/26/17 0303 03/26/17 0823 03/26/17 1150  GLUCAP 206* 146* 136* 128* 197*    Signed:  Vassie Loll MD.  [facility] 03/26/2017, 3:50 PM

## 2017-03-26 NOTE — Progress Notes (Signed)
Pt gone to stress test prior to this nurse arrival on unit/shift.

## 2017-03-26 NOTE — Progress Notes (Signed)
Pt discharged with daughter, accompanied by staff off the floor via wheelchair.

## 2017-03-26 NOTE — Progress Notes (Signed)
Progress Note  Patient Name: Korayma Hagwood Date of Encounter: 03/26/2017  Primary Cardiologist: Cardiologist in [place]  Subjective   No chest pain overnight  Inpatient Medications    Scheduled Meds: . amLODipine  10 mg Oral Daily  . clopidogrel  75 mg Oral Daily  . enalapril  10 mg Oral Daily   And  . hydrochlorothiazide  25 mg Oral Daily  . enoxaparin (LOVENOX) injection  40 mg Subcutaneous Daily  . famotidine  20 mg Oral BID  . insulin aspart  0-9 Units Subcutaneous TID WC  . simvastatin  10 mg Oral Daily   Continuous Infusions:  PRN Meds: acetaminophen, ondansetron (ZOFRAN) IV   Vital Signs    Vitals:   03/25/17 1326 03/25/17 1635 03/25/17 2011 03/26/17 0513  BP: (!) 165/72 (!) 185/76 (!) 156/70 (!) 148/74  Pulse: 73 76 73 80  Resp: Temp: 97.6 F (36.4 C) (!) 96.4 F (35.8 C) 98.5 F (36.9 C) 98.2 F (36.8 C)  TempSrc: Oral Oral Oral Oral  SpO2: 100% 100% 100% 97%  Weight:   272 lb 4.3 oz (123.5 kg) 263 lb 6.4 oz (119.5 kg)  Height:        Intake/Output Summary (Last 24 hours) at 03/26/17 0801 Last data filed at 03/25/17 2232  Gross per 24 hour  Intake              600 ml  Output             1700 ml  Net            -1100 ml   Filed Weights   03/25/17 0707 03/25/17 2011 03/26/17 0513  Weight: 265 lb 12.8 oz (120.6 kg) 272 lb 4.3 oz (123.5 kg) 263 lb 6.4 oz (119.5 kg)    Telemetry    NAR - Personally Reviewed  ECG    No new - Personally Reviewed  Physical Exam   GEN: Obese, No acute distress.   Neck: No JVD Cardiac: RRR, no murmurs, rubs, or gallops.  Respiratory: Clear to auscultation bilaterally. GI: Soft, nontender, non-distended  MS: No edema; No deformity. Neuro:  Nonfocal  Psych: Normal affect   Labs    Chemistry Recent Labs Lab 03/24/17 1600  NA 138  K 3.7  CL 106  CO2 25  GLUCOSE 81  BUN 27*  CREATININE 1.44*  CALCIUM 9.1  GFRNONAA 40*  GFRAA 46*  ANIONGAP 7     Hematology Recent Labs Lab  03/24/17 1600  WBC 6.2  RBC 3.31*  HGB 9.3*  HCT 27.6*  MCV 83.4  MCH 28.1  MCHC 33.7  RDW 15.0  PLT 289    Cardiac Enzymes Recent Labs Lab 03/24/17 1600 03/24/17 2348 03/25/17 0217 03/25/17 0505  TROPONINI <0.03 <0.03 <0.03 <0.03   No results for input(s): TROPIPOC in the last 168 hours.   BNP Recent Labs Lab 03/24/17 1600  BNP 27.6     DDimer No results for input(s): DDIMER in the last 168 hours.   Radiology    Dg Chest 2 View  Result Date: 03/24/2017 CLINICAL DATA:  Chest pain with cardiac palpitations EXAM: CHEST  2 VIEW COMPARISON:  None. FINDINGS: On the lateral view, there is a 1.4 x 1.7 cm nodular opacity anterior to the T11-12 area. This nodular opacity is not seen on the frontal view. No edema or consolidation. Heart size and pulmonary vascularity are normal. No adenopathy. There is degenerative change in the thoracic spine. IMPRESSION:  1.4 x 1.7 cm nodular opacity common lower thoracic region anterior to T11-12 seen on lateral view only. This finding warrants noncontrast enhanced chest CT to further evaluate. No edema or consolidation. Cardiac silhouette within normal limits.  No evident adenopathy. Electronically Signed   By: Bretta Bang III M.D.   On: 03/24/2017 16:21    Cardiac Studies   2 day Myoview in progress  Patient Profile     56 y.o. female from [place], here helping her daughter after she had surgery. The pt had a stent 3 years ago, stress test last year "OK". She developed chest pain, palpitations, and weakness-near syncope and came to the ED.   Assessment & Plan    1. Chest pain- r/o cardiac origin 2. CAD- h/o PCI 3 years ago 3. DM- 4. HTN 5. Near syncope- mild renal insufficiency (SCr 1.44) and anemia (Hgb 9.3) on admission  Plan: Complete Myoview. Re check labs- ? orthostatic symptoms prior to admission.    For questions or updates, please contact [facility] Please consult [email or web address] for contact info under  Cardiology/STEMI.      Jolene Provost, PA-C  03/26/2017, 8:01 AM

## 2017-03-26 NOTE — Progress Notes (Signed)
Call placed to [facility] to notify of telemetry monitoring d/c.   

## 2019-06-10 ENCOUNTER — Emergency Department (HOSPITAL_COMMUNITY)
Admission: EM | Admit: 2019-06-10 | Discharge: 2019-06-10 | Disposition: A | Discharge status: Home / Self Care | Payer: Medicaid Other | Attending: Emergency Medicine | Admitting: Emergency Medicine

## 2019-06-10 DIAGNOSIS — Z87891 Personal history of nicotine dependence: Secondary | ICD-10-CM | POA: Diagnosis not present

## 2019-06-10 DIAGNOSIS — Z91040 Latex allergy status: Secondary | ICD-10-CM | POA: Diagnosis not present

## 2019-06-10 DIAGNOSIS — E119 Type 2 diabetes mellitus without complications: Secondary | ICD-10-CM | POA: Insufficient documentation

## 2019-06-10 DIAGNOSIS — Z7984 Long term (current) use of oral hypoglycemic drugs: Secondary | ICD-10-CM | POA: Insufficient documentation

## 2019-06-10 DIAGNOSIS — I2510 Atherosclerotic heart disease of native coronary artery without angina pectoris: Secondary | ICD-10-CM | POA: Diagnosis not present

## 2019-06-10 DIAGNOSIS — I10 Essential (primary) hypertension: Secondary | ICD-10-CM | POA: Diagnosis not present

## 2019-06-10 DIAGNOSIS — R21 Rash and other nonspecific skin eruption: Secondary | ICD-10-CM | POA: Insufficient documentation

## 2019-06-10 DIAGNOSIS — Z79899 Other long term (current) drug therapy: Secondary | ICD-10-CM | POA: Diagnosis not present

## 2019-06-10 MED ORDER — DIPHENHYDRAMINE HCL 25 MG PO CAPS
25.0000 mg | ORAL_CAPSULE | Freq: Once | ORAL | Status: AC
  Administered 2019-06-10: 10:00:00 25 mg via ORAL
  Filled 2019-06-10: qty 1

## 2019-06-10 MED ORDER — PREDNISONE 20 MG PO TABS
60.0000 mg | ORAL_TABLET | Freq: Once | ORAL | Status: AC
  Administered 2019-06-10: 60 mg via ORAL
  Filled 2019-06-10: qty 3

## 2019-06-10 MED ORDER — PREDNISONE 20 MG PO TABS: 60.0000 mg | ORAL_TABLET | Freq: Every day | ORAL | 0 refills | Status: AC

## 2019-06-10 NOTE — ED Triage Notes (Signed)
Pt here from home with c/o rash to her lower back , pt has some raised bumps to across her lower back , no new soaps or detergent

## 2019-06-10 NOTE — ED Notes (Signed)
Pt verbalized understanding of d/c instructions and has no further questions, VSS, NAD.  

## 2019-06-10 NOTE — ED Notes (Signed)
Pt complains of rash to lower back and right upper back x 2 weeks. Has not changed anything at home, no new medications or foods. Has not had this happen before. Axox4. Airway intact.

## 2019-06-10 NOTE — Discharge Instructions (Signed)
This rash is most likely contact dermatitis.  I have given you your first dose of prednisone today, take it for the next 4 days in the morning with breakfast.  During the day you can use Zyrtec to help with itching and at night you can take Benadryl, this can make you drowsy.  You can also take Pepcid twice daily to help with itching and inflammation.  You have been given a prescription for steroids but all other medications can be found over-the-counter.  You can apply cool compresses to the areas that are itching to help with this.  Try and avoid scratching because you can put yourself at risk for bacterial infection from opening the skin.  Follow-up with your PCP if rash is not improving.

## 2019-06-10 NOTE — ED Provider Notes (Signed)
[facility] EMERGENCY DEPARTMENT Provider Note   CSN: [id number] Arrival date & time: 06/10/19  0825     History   Chief Complaint No chief complaint on file.   HPI Ruth Ortega is a 58 y.o. female.     Ruth Ortega is a 58 y.o. female with a history of hypertension, high cholesterol, and diabetes, who presents to the ED for evaluation of rash over her back that started 2 weeks ago on her low back and the backs of her legs.  She reports that the rash is spread up her back into her underarms.  She reports the rash is raised and extremely itchy.  She reports is only painful after scratching.  She has not noted any pus or drainage from the rash.  No bleeding.  No petechiae or purpura noted.  She has not had any associated fevers.  No facial swelling or difficulty breathing.  Patient does not know of any changed household products, denies any new medications or foods.  No one else in the home with similar rash.  She has not used anything to help with her symptoms prior to arrival.  No other aggravating or alleviating factors.     Past Medical History:  Diagnosis Date  . Diabetes mellitus without complication (HCC)   . High cholesterol   . Hypertension     Patient Active Problem List   Diagnosis Date Noted  . Gastroesophageal reflux disease   . Obesity, Class III, BMI 40-49.9 (morbid obesity) (HCC)   . CAD S/P percutaneous coronary angioplasty 03/25/2017  . Dyslipidemia 03/25/2017  . Chest pain with moderate risk of acute coronary syndrome 03/24/2017  . DM2 (diabetes mellitus, type 2) (HCC) 03/24/2017  . HTN (hypertension) 03/24/2017    Past Surgical History:  Procedure Laterality Date  . CORONARY ANGIOPLASTY WITH STENT PLACEMENT    . HERNIA REPAIR    . TUBAL LIGATION       OB History   No obstetric history on file.      Home Medications    Prior to Admission medications   Medication Sig Start Date End Date Taking? Authorizing Provider  carvedilol  (COREG) 25 MG tablet Take 1 tablet (25 mg total) by mouth 2 (two) times daily. 03/26/17   Vassie Loll, MD  clopidogrel (PLAVIX) 75 MG tablet Take 75 mg by mouth daily.    [provider]  enalapril-hydrochlorothiazide (VASERETIC) 10-25 MG tablet Take 1 tablet by mouth daily. 03/26/17   Vassie Loll, MD  famotidine (PEPCID) 20 MG tablet Take 20 mg by mouth 2 (two) times daily.    [provider]  metFORMIN (GLUCOPHAGE) 500 MG tablet Take 500 mg by mouth daily as needed.     [provider]  pantoprazole (PROTONIX) 40 MG tablet Take 1 tablet (40 mg total) by mouth daily. 03/26/17 03/26/18  Vassie Loll, MD  repaglinide (PRANDIN) 2 MG tablet Take 2 mg by mouth 3 (three) times daily before meals.    [provider]  simvastatin (ZOCOR) 10 MG tablet Take 10 mg by mouth daily.    [provider]    Family History Family History  Problem Relation Age of Onset  . Diabetes Mother   . Coronary artery disease Mother   . Coronary artery disease Father   . Diabetes Sister     Social History Social History   Tobacco Use  . Smoking status: Former Games developer  . Smokeless tobacco: Never Used  Substance Use Topics  . Alcohol  use: No  . Drug use: No     Allergies   Fiorinal [butalbital-aspirin-caffeine], Latex, and Penicillins   Review of Systems Review of Systems  Constitutional: Negative for diaphoresis and fever.  HENT: Negative for facial swelling and trouble swallowing.   Skin: Positive for rash.     Physical Exam Updated Vital Signs BP (!) 169/86   Pulse 88   Temp 98.2 F (36.8 C) (Oral)   Resp 20   SpO2 99%   Physical Exam Vitals signs and nursing note reviewed.  Constitutional:      General: She is not in acute distress.    Appearance: Normal appearance. She is well-developed. She is obese. She is not ill-appearing or diaphoretic.  HENT:     Head: Normocephalic and atraumatic.     Mouth/Throat:     Mouth: Mucous membranes  are moist.     Pharynx: Oropharynx is clear.     Comments: No mucosal involvement. Eyes:     General:        Right eye: No discharge.        Left eye: No discharge.  Pulmonary:     Effort: Pulmonary effort is normal. No respiratory distress.  Skin:    General: Skin is warm and dry.     Findings: Rash present.     Comments: Raised maculopapular rash over the back and under her arms, and the backs of the legs.  No pustules or vesicles, no petechiae.  No drainage from the rash.  No signs of cellulitis or superimposed infection.  No areas of abscess or fluctuance.  No skin sloughing.  No mucosal involvement  Neurological:     Mental Status: She is alert and oriented to person, place, and time.     Coordination: Coordination normal.  Psychiatric:        Behavior: Behavior normal.      ED Treatments / Results  Labs (all labs ordered are listed, but only abnormal results are displayed) Labs Reviewed - No data to display  EKG None  Radiology No results found.  Procedures Procedures (including critical care time)  Medications Ordered in ED Medications  predniSONE (DELTASONE) tablet 60 mg (60 mg Oral Given 06/10/19 0944)  diphenhydrAMINE (BENADRYL) capsule 25 mg (25 mg Oral Given 06/10/19 0944)     Initial Impression / Assessment and Plan / ED Course  I have reviewed the triage vital signs and the nursing notes.  Pertinent labs & imaging results that were available during my care of the patient were reviewed by me and considered in my medical decision making (see chart for details).  Rash consistent with contact dermatitis. Patient denies any difficulty breathing or swallowing.  Pt has a patent airway without stridor and is handling secretions without difficulty; no angioedema. No blisters, no pustules, no warmth, no draining sinus tracts, no superficial abscesses, no bullous impetigo, no vesicles, no desquamation, no target lesions with dusky purpura or a central bulla. Not  tender to touch. No concern for superimposed infection. No concern for SJS, TEN, TSS, tick borne illness, syphilis or other life-threatening condition. Will discharge home with short course of steroids, pepcid and recommend Benadryl or zyrtec as needed for pruritis.  Pt's blood pressure was elevated today, pt has hx of HTN, patient has upcoming appointment with her PCP to discuss blood pressure management as she reports has been running higher lately, pt is not exhibiting any symptoms to suggest hypertensive urgency or emergency today. Discussed long term consequences of untreated hypertension  with the patient.   Final Clinical Impressions(s) / ED Diagnoses   Final diagnoses:  Rash    ED Discharge Orders    None       Jacqlyn Larsen, [place] 06/10/19 7902    Varney Biles, MD 06/11/19 1555

## 2019-08-16 ENCOUNTER — Emergency Department (HOSPITAL_COMMUNITY)
Admission: EM | Admit: 2019-08-16 | Discharge: 2019-08-16 | Disposition: A | Discharge status: Home / Self Care | Payer: Medicaid Other | Attending: Emergency Medicine | Admitting: Emergency Medicine

## 2019-08-16 ENCOUNTER — Encounter (HOSPITAL_COMMUNITY): Payer: Self-pay

## 2019-08-16 ENCOUNTER — Emergency Department (HOSPITAL_COMMUNITY): Payer: Medicaid Other

## 2019-08-16 ENCOUNTER — Other Ambulatory Visit: Payer: Self-pay

## 2019-08-16 DIAGNOSIS — Y939 Activity, unspecified: Secondary | ICD-10-CM | POA: Diagnosis not present

## 2019-08-16 DIAGNOSIS — S6991XA Unspecified injury of right wrist, hand and finger(s), initial encounter: Secondary | ICD-10-CM | POA: Diagnosis present

## 2019-08-16 DIAGNOSIS — Z87891 Personal history of nicotine dependence: Secondary | ICD-10-CM | POA: Diagnosis not present

## 2019-08-16 DIAGNOSIS — Z91040 Latex allergy status: Secondary | ICD-10-CM | POA: Insufficient documentation

## 2019-08-16 DIAGNOSIS — I10 Essential (primary) hypertension: Secondary | ICD-10-CM | POA: Diagnosis not present

## 2019-08-16 DIAGNOSIS — Z7901 Long term (current) use of anticoagulants: Secondary | ICD-10-CM | POA: Diagnosis not present

## 2019-08-16 DIAGNOSIS — Z7984 Long term (current) use of oral hypoglycemic drugs: Secondary | ICD-10-CM | POA: Diagnosis not present

## 2019-08-16 DIAGNOSIS — Y92410 Unspecified street and highway as the place of occurrence of the external cause: Secondary | ICD-10-CM | POA: Insufficient documentation

## 2019-08-16 DIAGNOSIS — E119 Type 2 diabetes mellitus without complications: Secondary | ICD-10-CM | POA: Diagnosis not present

## 2019-08-16 DIAGNOSIS — Y999 Unspecified external cause status: Secondary | ICD-10-CM | POA: Diagnosis not present

## 2019-08-16 NOTE — ED Notes (Signed)
Pt dc'd home w/all belongings, a/o x4, ambulatory on dc  
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
## 2019-08-16 NOTE — Discharge Instructions (Signed)
Continue Tylenol and over the counter pain cream as needed Wear brace to provide support. Do wrist range of motion exercises daily to prevent stiffness Please follow up with your doctor if symptoms are not improving
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
## 2019-08-16 NOTE — Progress Notes (Signed)
Orthopedic Tech Progress Note Patient Details:  Ruth Ortega [birth year] [id number]  Ortho Devices Type of Ortho Device: Velcro wrist forearm splint Ortho Device/Splint Location: RUE Ortho Device/Splint Interventions: Application, Ordered   Post Interventions Patient Tolerated: Well Instructions Provided: Care of device, Adjustment of device   Donald Pore 08/16/2019, 9:58 AM

## 2020-01-16 ENCOUNTER — Other Ambulatory Visit: Payer: Self-pay

## 2020-01-16 ENCOUNTER — Ambulatory Visit (HOSPITAL_COMMUNITY)
Admission: EM | Admit: 2020-01-16 | Discharge: 2020-01-16 | Disposition: A | Discharge status: Home / Self Care | Payer: Medicaid Other

## 2020-01-23 ENCOUNTER — Other Ambulatory Visit: Payer: Self-pay

## 2020-01-23 ENCOUNTER — Emergency Department (HOSPITAL_COMMUNITY): Payer: Medicaid Other

## 2020-01-23 ENCOUNTER — Emergency Department (HOSPITAL_COMMUNITY)
Admission: EM | Admit: 2020-01-23 | Discharge: 2020-01-23 | Disposition: A | Discharge status: Home / Self Care | Payer: Medicaid Other | Attending: Emergency Medicine | Admitting: Emergency Medicine

## 2020-01-23 ENCOUNTER — Encounter (HOSPITAL_COMMUNITY): Payer: Self-pay | Admitting: *Deleted

## 2020-01-23 DIAGNOSIS — Z79899 Other long term (current) drug therapy: Secondary | ICD-10-CM | POA: Insufficient documentation

## 2020-01-23 DIAGNOSIS — M25531 Pain in right wrist: Secondary | ICD-10-CM

## 2020-01-23 DIAGNOSIS — E119 Type 2 diabetes mellitus without complications: Secondary | ICD-10-CM | POA: Diagnosis not present

## 2020-01-23 DIAGNOSIS — Z91040 Latex allergy status: Secondary | ICD-10-CM | POA: Insufficient documentation

## 2020-01-23 DIAGNOSIS — Z87891 Personal history of nicotine dependence: Secondary | ICD-10-CM | POA: Diagnosis not present

## 2020-01-23 DIAGNOSIS — I10 Essential (primary) hypertension: Secondary | ICD-10-CM | POA: Diagnosis not present
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
## 2020-01-23 NOTE — Discharge Instructions (Signed)
You can take 1000 mg of Tylenol.  Do not exceed 4000 mg of Tylenol a day.  Follow the RICE (Rest, Ice, Compression, Elevation) protocol as directed.   Wear brace for support and stabilization.   Return to the Emergency Dept for any worsening pain, redness, fever, or any other worsening or concerning symptoms.

## 2020-01-23 NOTE — ED Triage Notes (Signed)
The pt is c/o pain in  Her rt wrist for months  She has been using a plaster splint since  May   She came in today with her daughter who is having surgery upstairs and decided to have her wrist checked [place]

## 2020-06-06 ENCOUNTER — Other Ambulatory Visit: Payer: Self-pay

## 2020-06-06 ENCOUNTER — Emergency Department (HOSPITAL_COMMUNITY)
Admission: EM | Admit: 2020-06-06 | Discharge: 2020-06-06 | Disposition: A | Discharge status: Home / Self Care | Payer: Medicaid Other | Attending: Emergency Medicine | Admitting: Emergency Medicine

## 2020-06-06 ENCOUNTER — Emergency Department (HOSPITAL_COMMUNITY): Payer: Medicaid Other

## 2020-06-06 ENCOUNTER — Encounter (HOSPITAL_COMMUNITY): Payer: Self-pay | Admitting: Emergency Medicine

## 2020-06-06 DIAGNOSIS — Z79899 Other long term (current) drug therapy: Secondary | ICD-10-CM | POA: Diagnosis not present

## 2020-06-06 DIAGNOSIS — I10 Essential (primary) hypertension: Secondary | ICD-10-CM | POA: Insufficient documentation

## 2020-06-06 DIAGNOSIS — Z87891 Personal history of nicotine dependence: Secondary | ICD-10-CM | POA: Diagnosis not present

## 2020-06-06 DIAGNOSIS — R079 Chest pain, unspecified: Secondary | ICD-10-CM | POA: Diagnosis present

## 2020-06-06 DIAGNOSIS — E119 Type 2 diabetes mellitus without complications: Secondary | ICD-10-CM | POA: Diagnosis not present

## 2020-06-06 DIAGNOSIS — R0789 Other chest pain: Secondary | ICD-10-CM

## 2020-06-06 DIAGNOSIS — I2510 Atherosclerotic heart disease of native coronary artery without angina pectoris: Secondary | ICD-10-CM | POA: Diagnosis not present

## 2020-06-06 DIAGNOSIS — R202 Paresthesia of skin: Secondary | ICD-10-CM | POA: Diagnosis not present

## 2020-06-06 DIAGNOSIS — Z91040 Latex allergy status: Secondary | ICD-10-CM | POA: Insufficient documentation

## 2020-06-06 LAB — CBC
HCT: 31.8 % — ABNORMAL LOW (ref 36.0–46.0)
Hemoglobin: 9.8 g/dL — ABNORMAL LOW (ref 12.0–15.0)
MCH: 26.8 pg (ref 26.0–34.0)
MCHC: 30.8 g/dL (ref 30.0–36.0)
MCV: 86.9 fL (ref 80.0–100.0)
Platelets: 213 10*3/uL (ref 150–400)
RBC: 3.66 MIL/uL — ABNORMAL LOW (ref 3.87–5.11)
RDW: 14.8 % (ref 11.5–15.5)
WBC: 4.8 10*3/uL (ref 4.0–10.5)
nRBC: 0 % (ref 0.0–0.2)

## 2020-06-06 LAB — TROPONIN I (HIGH SENSITIVITY)
Troponin I (High Sensitivity): 5 ng/L (ref <18)
Troponin I (High Sensitivity): 5 ng/L (ref <18)

## 2020-06-06 LAB — BASIC METABOLIC PANEL
Anion gap: 10 (ref 5–15)
BUN: 28 mg/dL — ABNORMAL HIGH (ref 6–20)
CO2: 24 mmol/L (ref 22–32)
Calcium: 9.3 mg/dL (ref 8.9–10.3)
Chloride: 100 mmol/L (ref 98–111)
Creatinine, Ser: 1.83 mg/dL — ABNORMAL HIGH (ref 0.44–1.00)
GFR, Estimated: 31 mL/min — ABNORMAL LOW (ref >60)
Glucose, Bld: 358 mg/dL — ABNORMAL HIGH (ref 70–99)
Potassium: 4.2 mmol/L (ref 3.5–5.1)
Sodium: 134 mmol/L — ABNORMAL LOW (ref 135–145)

## 2020-06-06 MED ORDER — CLOPIDOGREL BISULFATE 75 MG PO TABS: 75.0000 mg | ORAL_TABLET | Freq: Every day | ORAL | 0 refills | Status: AC

## 2020-06-06 MED ORDER — GLIPIZIDE 10 MG PO TABS: 10.0000 mg | ORAL_TABLET | Freq: Every day | ORAL | 0 refills | Status: AC

## 2020-06-06 NOTE — ED Notes (Signed)
Patient transported to CT 

## 2020-06-06 NOTE — ED Triage Notes (Signed)
Pt here from home with c/o left side pain along with chest pain off and on for 1 month , pt hypertensive in triage with hx of same

## 2020-06-06 NOTE — ED Notes (Signed)
Got patient into a gown on the monitor patient is resting with call bell in reach got patient some warm blankets  

## 2020-06-06 NOTE — Discharge Instructions (Addendum)
Follow-up with your primary doctor as scheduled, and return to the ER if symptoms significantly worsen or change.

## 2020-06-06 NOTE — ED Provider Notes (Signed)
[facility] EMERGENCY DEPARTMENT Provider Note   CSN: [id number] Arrival date & time: 06/06/20  0741     History No chief complaint on file.   Ruth Ortega is a 59 y.o. female.  Patient is a 59 year old female with history of diabetes, hypertension, hyperlipidemia, coronary artery disease with stent, and prior CVA.  She presents today for evaluation of pain and numbness to the left side of her chest.  This has been ongoing for close to 1 month.  She describes a constant ache to the left side of her chest with numbness of her neck and left arm.  She denies any shortness of breath, nausea, or diaphoresis.  She denies any weakness.  Patient states this feels similar to the symptoms she experienced both with her stroke and her prior heart issues.  The history is provided by the patient.       Past Medical History:  Diagnosis Date  . Diabetes mellitus without complication (HCC)   . High cholesterol   . Hypertension     Patient Active Problem List   Diagnosis Date Noted  . Gastroesophageal reflux disease   . Obesity, Class III, BMI 40-49.9 (morbid obesity) (HCC)   . CAD S/P percutaneous coronary angioplasty 03/25/2017  . Dyslipidemia 03/25/2017  . Chest pain with moderate risk of acute coronary syndrome 03/24/2017  . DM2 (diabetes mellitus, type 2) (HCC) 03/24/2017  . HTN (hypertension) 03/24/2017    Past Surgical History:  Procedure Laterality Date  . CORONARY ANGIOPLASTY WITH STENT PLACEMENT    . HERNIA REPAIR    . TUBAL LIGATION       OB History   No obstetric history on file.     Family History  Problem Relation Age of Onset  . Diabetes Mother   . Coronary artery disease Mother   . Coronary artery disease Father   . Diabetes Sister     Social History   Tobacco Use  . Smoking status: Former Games developer  . Smokeless tobacco: Never Used  Substance Use Topics  . Alcohol use: No  . Drug use: No    Home Medications Prior to Admission  medications   Medication Sig Start Date End Date Taking? Authorizing Provider  amLODipine (NORVASC) 5 MG tablet Take 5 mg by mouth daily.  06/15/19   [provider]  atorvastatin (LIPITOR) 40 MG tablet Take 40 mg by mouth daily. 07/10/19   [provider]  Cholecalciferol (VITAMIN D3) 50 MCG (2000 UT) capsule Take 2,000 Units by mouth daily. 07/18/19   [provider]  clopidogrel (PLAVIX) 75 MG tablet Take 75 mg by mouth daily.    [provider]  FLOVENT HFA 110 MCG/ACT inhaler Inhale 1 puff into the lungs 2 (two) times daily. 07/05/19   [provider]  triamcinolone cream (KENALOG) 0.5 % Apply 1 application topically 2 (two) times daily as needed. 06/29/19   [provider]  carvedilol (COREG) 25 MG tablet Take 1 tablet (25 mg total) by mouth 2 (two) times daily. Patient not taking: Reported on 08/16/2019 03/26/17 08/16/19  Vassie Loll, MD  enalapril-hydrochlorothiazide (VASERETIC) 10-25 MG tablet Take 1 tablet by mouth daily. Patient not taking: Reported on 08/16/2019 03/26/17 08/16/19  Vassie Loll, MD    Allergies    Fiorinal [butalbital-aspirin-caffeine], Nsaids, Penicillins, and Latex  Review of Systems   Review of Systems  All other systems reviewed and are negative.   Physical Exam Updated Vital Signs BP (!) 192/84   Pulse 76  Temp 98 F (36.7 C) (Oral)   Resp 19   Wt 129.3 kg   SpO2 100%   BMI 50.49 kg/m   Physical Exam Vitals and nursing note reviewed.  Constitutional:      General: She is not in acute distress.    Appearance: She is well-developed. She is not diaphoretic.  HENT:     Head: Normocephalic and atraumatic.  Eyes:     Extraocular Movements: Extraocular movements intact.     Pupils: Pupils are equal, round, and reactive to light.  Cardiovascular:     Rate and Rhythm: Normal rate and regular rhythm.     Heart sounds: No murmur heard.  No friction rub. No gallop.   Pulmonary:     Effort: Pulmonary  effort is normal. No respiratory distress.     Breath sounds: Normal breath sounds. No wheezing.  Abdominal:     General: Bowel sounds are normal. There is no distension.     Palpations: Abdomen is soft.     Tenderness: There is no abdominal tenderness.  Musculoskeletal:        General: No swelling or tenderness. Normal range of motion.     Cervical back: Normal range of motion and neck supple.     Right lower leg: No edema.     Left lower leg: No edema.  Skin:    General: Skin is warm and dry.  Neurological:     General: No focal deficit present.     Mental Status: She is alert and oriented to person, place, and time.     Cranial Nerves: No cranial nerve deficit.     Motor: No weakness.     Coordination: Coordination normal.     ED Results / Procedures / Treatments   Labs (all labs ordered are listed, but only abnormal results are displayed) Labs Reviewed  CBC - Abnormal; Notable for the following components:      Result Value   RBC 3.66 (*)    Hemoglobin 9.8 (*)    HCT 31.8 (*)    All other components within normal limits  BASIC METABOLIC PANEL  TROPONIN I (HIGH SENSITIVITY)    EKG EKG Interpretation  Date/Time:  Wednesday June 06 2020 08:01:57 EST Ventricular Rate:  68 PR Interval:  196 QRS Duration: 82 QT Interval:  352 QTC Calculation: 374 R Axis:   28 Text Interpretation: Normal sinus rhythm Nonspecific ST abnormality Abnormal ECG Unchanged from 03/25/2017 Confirmed by Geoffery Lyons ([id number]) on 06/06/2020 8:14:44 AM   Radiology DG Chest 2 View  Result Date: 06/06/2020 CLINICAL DATA:  Left-sided sharp chest pain x1 month EXAM: CHEST - 2 VIEW COMPARISON:  Chest radiograph March 24, 2017 FINDINGS: The heart size and mediastinal contours are within normal limits. Both lungs are clear. No visible pneumothorax. No pleural effusion. Thoracic spondylosis. IMPRESSION: No active cardiopulmonary disease. Electronically Signed   By: Maudry Mayhew MD   On:  06/06/2020 08:30    Procedures Procedures (including critical care time)  Medications Ordered in ED Medications - No data to display  ED Course  I have reviewed the triage vital signs and the nursing notes.  Pertinent labs & imaging results that were available during my care of the patient were reviewed by me and considered in my medical decision making (see chart for details).    MDM Rules/Calculators/A&P  Patient presenting here with complaints of chest discomfort as described in the HPI.  She is concerned about stroke as she tells me her symptoms  are similar to this in the past with a prior stroke.  Head CT is negative and she is otherwise neurologically intact.  Patient symptoms have been ongoing for the past month and EKG shows no acute findings with negative troponin.  I highly doubt a cardiac etiology.  At this point I feel as though patient is appropriate for discharge.  She is new to the area and has a prompt follow-up with a new physician in the next 1 to 2 weeks.  She is to return in the meantime if symptoms worsen or change.  Final Clinical Impression(s) / ED Diagnoses Final diagnoses:  None    Rx / DC Orders ED Discharge Orders    None       Geoffery Lyons, MD 06/06/20 1116

## 2020-09-26 ENCOUNTER — Ambulatory Visit: Payer: Medicaid Other | Admitting: Dietician

## 2020-10-03 ENCOUNTER — Ambulatory Visit: Payer: Medicaid Other | Admitting: Skilled Nursing Facility1

## 2021-01-13 ENCOUNTER — Emergency Department (HOSPITAL_COMMUNITY)
Admission: EM | Admit: 2021-01-13 | Discharge: 2021-01-13 | Disposition: A | Discharge status: Home / Self Care | Payer: Medicaid Other | Attending: Emergency Medicine | Admitting: Emergency Medicine

## 2021-01-13 ENCOUNTER — Other Ambulatory Visit: Payer: Self-pay

## 2021-01-13 ENCOUNTER — Emergency Department (HOSPITAL_COMMUNITY): Payer: Medicaid Other

## 2021-01-13 ENCOUNTER — Encounter (HOSPITAL_COMMUNITY): Payer: Self-pay | Admitting: Emergency Medicine

## 2021-01-13 DIAGNOSIS — G8929 Other chronic pain: Secondary | ICD-10-CM | POA: Insufficient documentation

## 2021-01-13 DIAGNOSIS — M545 Low back pain, unspecified: Secondary | ICD-10-CM | POA: Diagnosis not present

## 2021-01-13 DIAGNOSIS — I2510 Atherosclerotic heart disease of native coronary artery without angina pectoris: Secondary | ICD-10-CM | POA: Insufficient documentation

## 2021-01-13 DIAGNOSIS — Z7902 Long term (current) use of antithrombotics/antiplatelets: Secondary | ICD-10-CM | POA: Insufficient documentation

## 2021-01-13 DIAGNOSIS — E119 Type 2 diabetes mellitus without complications: Secondary | ICD-10-CM | POA: Insufficient documentation

## 2021-01-13 DIAGNOSIS — Z87891 Personal history of nicotine dependence: Secondary | ICD-10-CM | POA: Diagnosis not present

## 2021-01-13 DIAGNOSIS — Z79899 Other long term (current) drug therapy: Secondary | ICD-10-CM | POA: Diagnosis not present

## 2021-01-13 DIAGNOSIS — Z7984 Long term (current) use of oral hypoglycemic drugs: Secondary | ICD-10-CM | POA: Diagnosis not present

## 2021-01-13 DIAGNOSIS — R0601 Orthopnea: Secondary | ICD-10-CM | POA: Insufficient documentation

## 2021-01-13 DIAGNOSIS — Z20822 Contact with and (suspected) exposure to covid-19: Secondary | ICD-10-CM | POA: Diagnosis not present

## 2021-01-13 DIAGNOSIS — R0602 Shortness of breath: Secondary | ICD-10-CM | POA: Diagnosis not present

## 2021-01-13 DIAGNOSIS — Z91040 Latex allergy status: Secondary | ICD-10-CM | POA: Insufficient documentation

## 2021-01-13 DIAGNOSIS — R600 Localized edema: Secondary | ICD-10-CM | POA: Diagnosis not present

## 2021-01-13 DIAGNOSIS — I10 Essential (primary) hypertension: Secondary | ICD-10-CM | POA: Diagnosis not present

## 2021-01-13 DIAGNOSIS — R202 Paresthesia of skin: Secondary | ICD-10-CM

## 2021-01-13 LAB — D-DIMER, QUANTITATIVE: D-Dimer, Quant: 0.46 ug/mL-FEU (ref 0.00–0.50)

## 2021-01-13 LAB — CBC
HCT: 28.2 % — ABNORMAL LOW (ref 36.0–46.0)
Hemoglobin: 8.6 g/dL — ABNORMAL LOW (ref 12.0–15.0)
MCH: 26.5 pg (ref 26.0–34.0)
MCHC: 30.5 g/dL (ref 30.0–36.0)
MCV: 87 fL (ref 80.0–100.0)
Platelets: 234 10*3/uL (ref 150–400)
RBC: 3.24 MIL/uL — ABNORMAL LOW (ref 3.87–5.11)
RDW: 17.1 % — ABNORMAL HIGH (ref 11.5–15.5)
WBC: 5.9 10*3/uL (ref 4.0–10.5)
nRBC: 0 % (ref 0.0–0.2)

## 2021-01-13 LAB — RESP PANEL BY RT-PCR (FLU A&B, COVID) ARPGX2
Influenza A by PCR: NEGATIVE
Influenza B by PCR: NEGATIVE
SARS Coronavirus 2 by RT PCR: NEGATIVE

## 2021-01-13 LAB — BASIC METABOLIC PANEL
Anion gap: 6 (ref 5–15)
BUN: 30 mg/dL — ABNORMAL HIGH (ref 6–20)
CO2: 24 mmol/L (ref 22–32)
Calcium: 9 mg/dL (ref 8.9–10.3)
Chloride: 107 mmol/L (ref 98–111)
Creatinine, Ser: 1.77 mg/dL — ABNORMAL HIGH (ref 0.44–1.00)
GFR, Estimated: 33 mL/min — ABNORMAL LOW (ref >60)
Glucose, Bld: 188 mg/dL — ABNORMAL HIGH (ref 70–99)
Potassium: 4.4 mmol/L (ref 3.5–5.1)
Sodium: 137 mmol/L (ref 135–145)

## 2021-01-13 LAB — BRAIN NATRIURETIC PEPTIDE: B Natriuretic Peptide: 39.3 pg/mL (ref 0.0–100.0)

## 2021-01-13 LAB — TROPONIN I (HIGH SENSITIVITY): Troponin I (High Sensitivity): 5 ng/L (ref <18)
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
## 2021-01-13 NOTE — ED Notes (Signed)
Patient verbalizes understanding of discharge instructions. Opportunity for questioning and answers were provided. Armband removed by staff, pt discharged from ED via wheelchair to lobby to go home.

## 2021-01-13 NOTE — Discharge Instructions (Addendum)
You came to the emergency department today to be evaluated for your shortness of breath and left lower leg pain.  Your physical exam, lab work, chest x-ray, and EKG were reassuring.  I placed a referral to cardiology, they should contact you within the next 2-3 business days to schedule a follow-up appointment.  It is very important that you schedule a follow-up appointment to further investigate your exertional shortness of breath.  Please follow-up with your primary care provider about your left leg pain.  I have also given you information to follow-up with [facility] for this symptom as well.   You have a COVID test pending. Please isolate at home while awaiting your results.  You can find your results on [facility]. > If your test is negative, stay home until your fever has resolved/your symptoms are improving. > If your test is positive, isolate at home for at least 7 days after the day your symptoms initially began, and THEN at least 24 hours after you are fever-free without the help of medications (Tylenol/acetaminophen and Advil/ibuprofen/Motrin) AND your symptoms are improving.  Get help right away if: Your shortness of breath gets worse. You have shortness of breath when you are resting. You feel light-headed or you faint. You have a cough that is not controlled with medicines. You cough up blood. You have pain with breathing. You have pain in your chest, arms, shoulders, or abdomen. You have a fever. You cannot walk up stairs or exercise the way that you normally do.

## 2021-01-13 NOTE — ED Triage Notes (Signed)
Pt reports SOB x 3-4 weeks.  Denies cough.  Also reports burning pain to L leg x 2-3 months.  No known injury.  Denies swelling.

## 2021-02-01 NOTE — Progress Notes (Deleted)
Cardiology Office Note:    Date:  02/01/2021   ID:  Kathi Der, DOB 04/03/1961, MRN [id number]  PCP:  Pcp, No   [facility] Providers Cardiologist:  None {   Referring MD: Esmeralda Arthur*    History of Present Illness:    Ruth Ortega is a 60 y.o. female with a hx of DMII, HTN, HLD, prior history of CVA, and CAD s/p PCI in 2018 (***) who was referred by Parke Poisson, PA for further management of SOB.  Was seen in [facility] ER 07/10 for 1 month of progressive dyspnea, orthopnea, LE edema and PND. In the ER, trop negative. BNP 39, d-dimer 0.46, CXR without evidence of cardiopulmonary disease. Given reassuring work-up, patient was discharged home with plans for Cardiology follow-up.  Today,  Past Medical History:  Diagnosis Date   Chest pain    Diabetes mellitus without complication (HCC)    High cholesterol    Hypertension    SOB (shortness of breath)     Past Surgical History:  Procedure Laterality Date   CORONARY ANGIOPLASTY WITH STENT PLACEMENT     HERNIA REPAIR     TUBAL LIGATION      Current Medications: No outpatient medications have been marked as taking for the 02/07/21 encounter (Appointment) with Meriam Sprague, MD.     Allergies:   Fiorinal [butalbital-aspirin-caffeine], Nsaids, Penicillins, and Latex   Social History   Socioeconomic History   Marital status: Single    Spouse name: Not on file   Number of children: Not on file   Years of education: Not on file   Highest education level: Not on file  Occupational History   Not on file  Tobacco Use   Smoking status: Former   Smokeless tobacco: Never  Substance and Sexual Activity   Alcohol use: No   Drug use: No   Sexual activity: Not on file  Other Topics Concern   Not on file  Social History Narrative   Not on file   Social Determinants of Health   Financial Resource Strain: Not on file  Food Insecurity: Not on file  Transportation Needs: Not on file  Physical Activity: Not  on file  Stress: Not on file  Social Connections: Not on file     Family History: The patient's ***family history includes Coronary artery disease in her father and mother; Diabetes in her mother and sister.  ROS:   Please see the history of present illness.    *** All other systems reviewed and are negative.  EKGs/Labs/Other Studies Reviewed:    The following studies were reviewed today: Myoview 2018: There was no ST segment deviation noted during stress. No T wave inversion was noted during stress. This is a low risk study. The left ventricular ejection fraction is normal (55-65%). Nuclear stress EF: 59%.   1. Fixed, medium-sized, mild basal to mid anterior perfusion defect.  Given normal wall motion, this may represent soft tissue attenuation.  No definite ischemia. 2. EF 59% with normal wall motion.   Low risk study.  EKG:  EKG is *** ordered today.  The ekg ordered today demonstrates ***  Recent Labs: 01/13/2021: B Natriuretic Peptide 39.3; BUN 30; Creatinine, Ser 1.77; Hemoglobin 8.6; Platelets 234; Potassium 4.4; Sodium 137  Recent Lipid Panel No results found for: CHOL, TRIG, HDL, CHOLHDL, VLDL, LDLCALC, LDLDIRECT   Risk Assessment/Calculations:   {Does this patient have ATRIAL FIBRILLATION?:[phone]}       Physical Exam:    VS:  There were no vitals taken for this visit.    Wt Readings from Last 3 Encounters:  06/06/20 285 lb (129.3 kg)  01/23/20 263 lb 7.2 oz (119.5 kg)  03/26/17 263 lb 6.4 oz (119.5 kg)     GEN: *** Well nourished, well developed in no acute distress HEENT: Normal NECK: No JVD; No carotid bruits LYMPHATICS: No lymphadenopathy CARDIAC: ***RRR, no murmurs, rubs, gallops RESPIRATORY:  Clear to auscultation without rales, wheezing or rhonchi  ABDOMEN: Soft, non-tender, non-distended MUSCULOSKELETAL:  No edema; No deformity  SKIN: Warm and dry NEUROLOGIC:  Alert and oriented x 3 PSYCHIATRIC:  Normal affect   ASSESSMENT:    No  diagnosis found. PLAN:    In order of problems listed above:  #CAD s/p PCI in 2015 ([place]): #DOE: Patient with history of PCI in [place] in 2015. Was seen by Cardiology in 2018 due to chest pain with myoview negative for ischemia. Re-presented to ER on 01/13/21 with worsening dyspnea. Work-up reassuring with negative trop, d-dimer, and CXR. ECG without ischemic changes. Given history, will repeat myoview and TTE -Check myoview -Check TTE -Continue crestor 20mg  dailuy -Continue plavix 75mg  daily -Continue coreg 12.5mg  BID  #LE edema: *** -Check TTE as above  #HTN: -Continue amlodipine 10mg  daily -Continue coreg 12.5mg  BID  #HLD: -Continue crestor 20mg  daily  #DMII: -Continue glipizide -Consider SGLT-2i   {Are you ordering a CV Procedure (e.g. stress test, cath, DCCV, TEE, etc)?   Press F2        :    Medication Adjustments/Labs and Tests Ordered: Current medicines are reviewed at length with the patient today.  Concerns regarding medicines are outlined above.  No orders of the defined types were placed in this encounter.  No orders of the defined types were placed in this encounter.   There are no Patient Instructions on file for this visit.   Signed, , MD  02/01/2021 7:51 AM    [facility]

## 2021-02-07 ENCOUNTER — Ambulatory Visit: Payer: Medicaid Other | Admitting: Cardiology

## 2022-07-26 IMAGING — CT CT HEAD W/O CM
3 series · 16 of 47 positions shown, 19 images · non-contrast
Comparison: None

CLINICAL DATA: Suspected stroke with neuro deficit.

EXAM:
CT HEAD WITHOUT CONTRAST
TECHNIQUE: Contiguous axial images were obtained from the base of the skull
through the vertex without intravenous contrast.

[Series 3: head 5.0 h30s · axial · 0.43mm/px · z∈[-59,+76]mm · 10 of 33 slices shown, 13 images]
[im 3/33  brain]
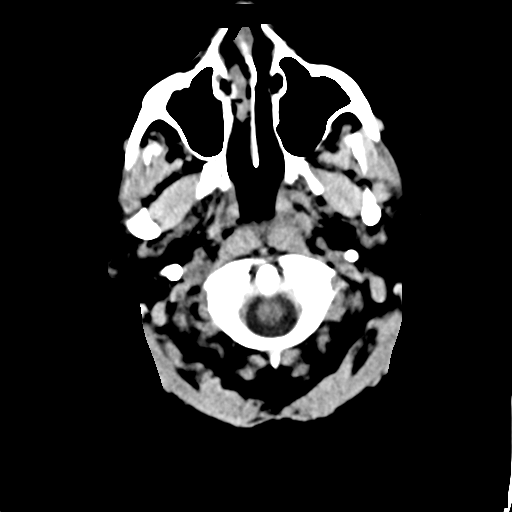
[im 3/33  bone]
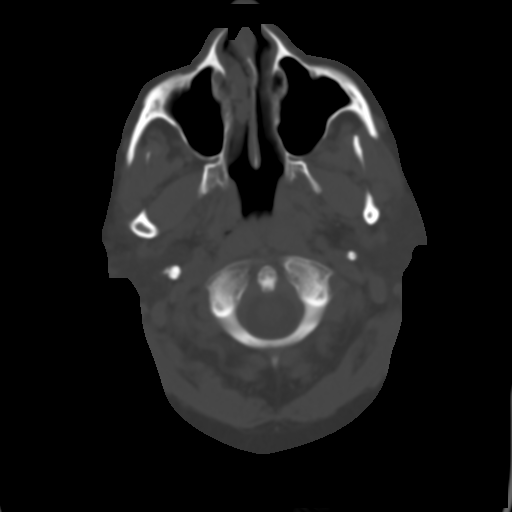
[im 6/33  brain]
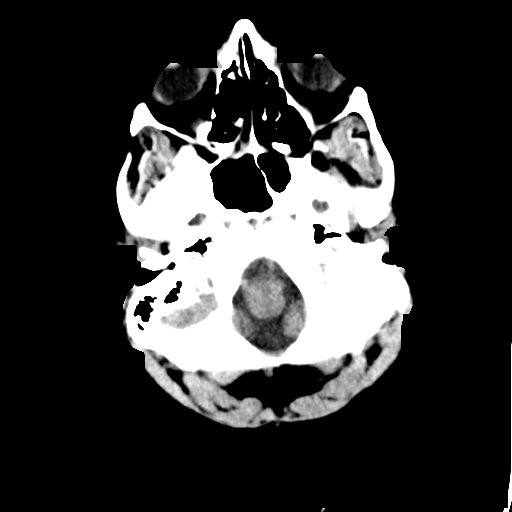
[im 9/33  brain]
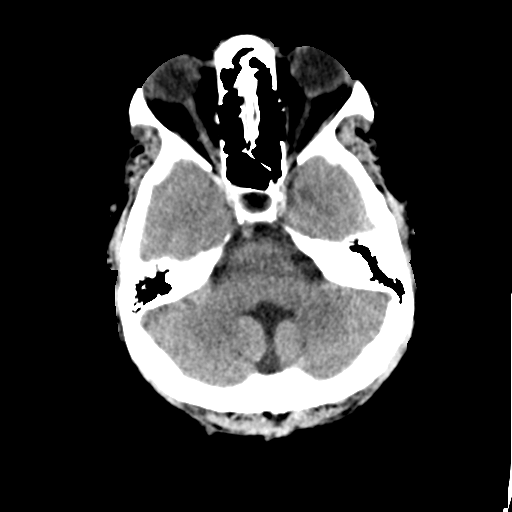
[im 12/33  brain]
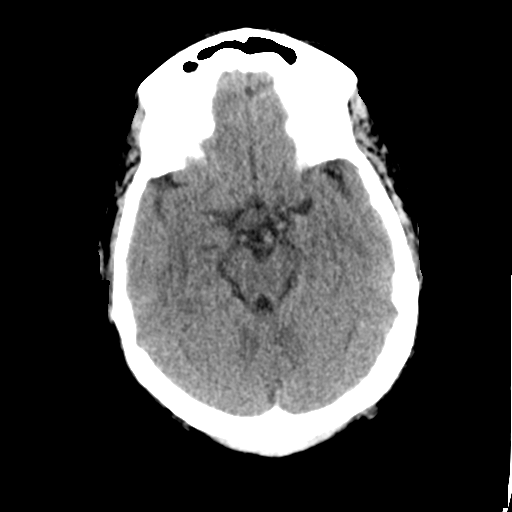
[im 15/33  brain]
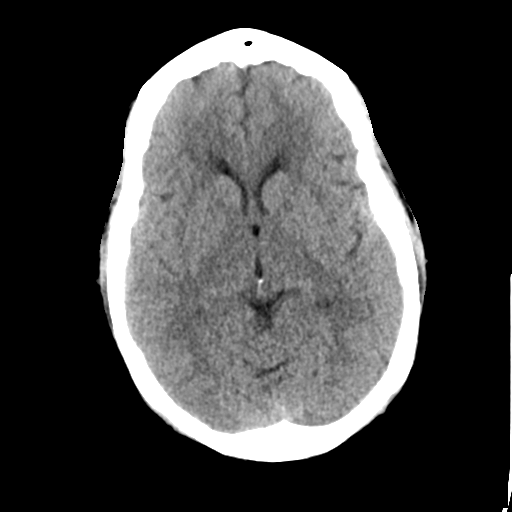
[im 15/33  bone]
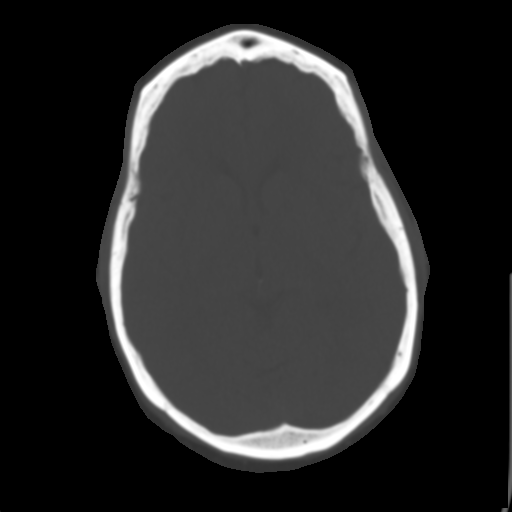
[im 18/33  brain]
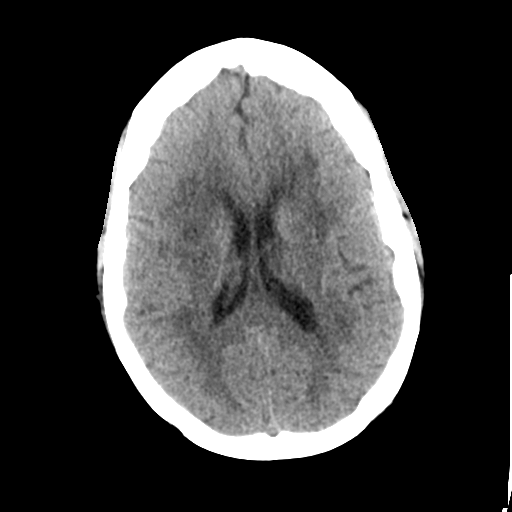
[im 21/33  brain]
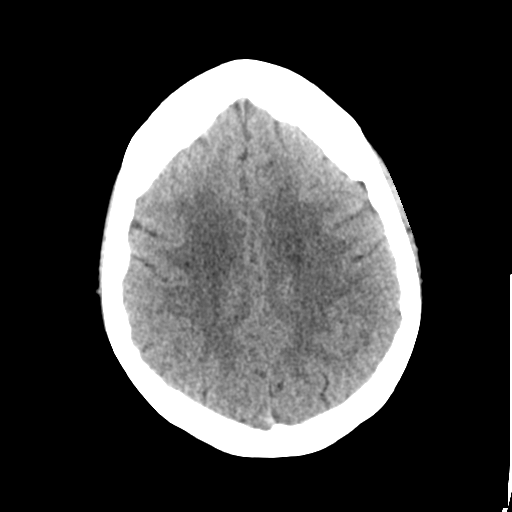
[im 25/33  brain]
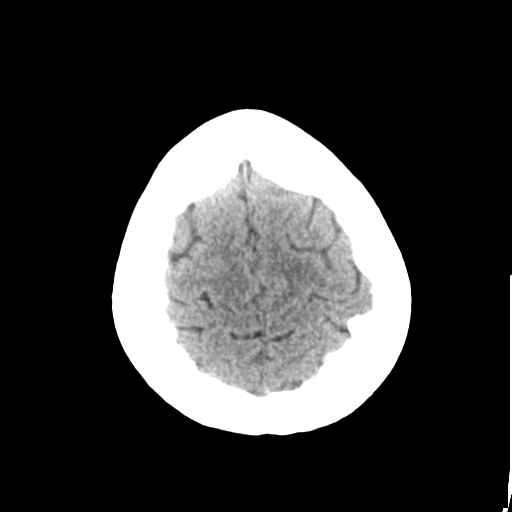
[im 27/33  brain]
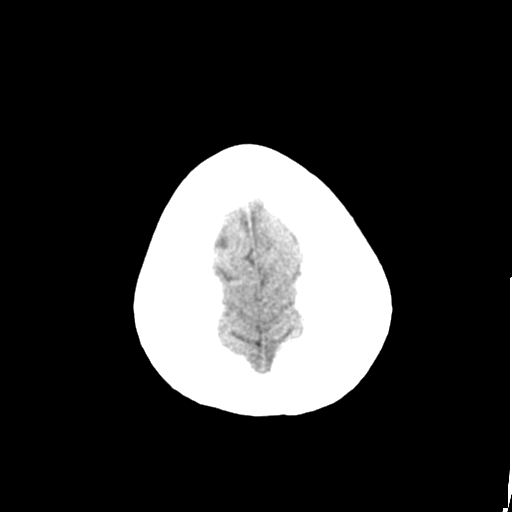
[im 27/33  bone]
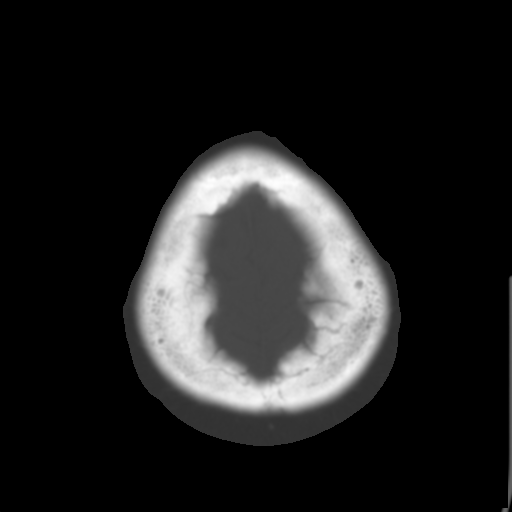
[im 30/33  brain]
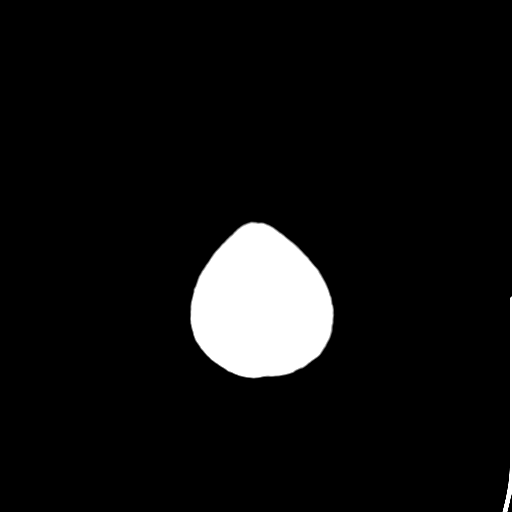

[Series 5: head 3.0 mpr cor · coronal · 0.32mm/px · 3 of 67 slices shown]
[im 23/67  brain]
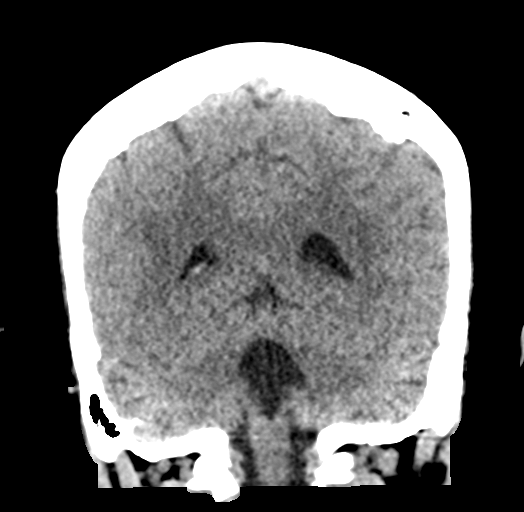
[im 30/67  brain]
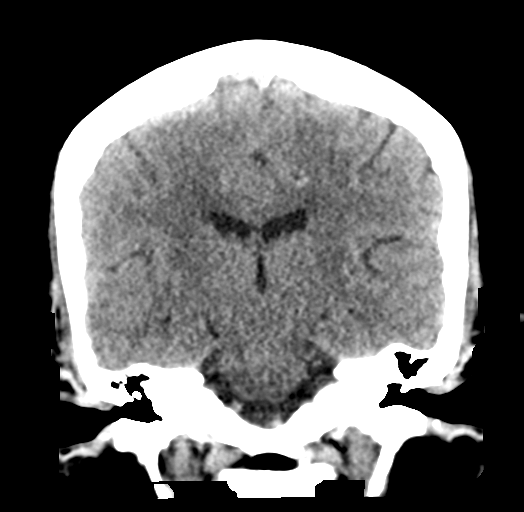
[im 37/67  brain]
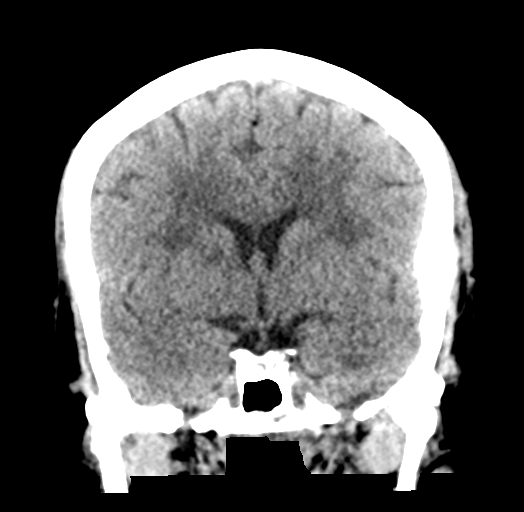

[Series 6: head 3.0 mpr sag · sagittal · 0.36mm/px · 3 of 67 slices shown]
[im 23/67  brain]
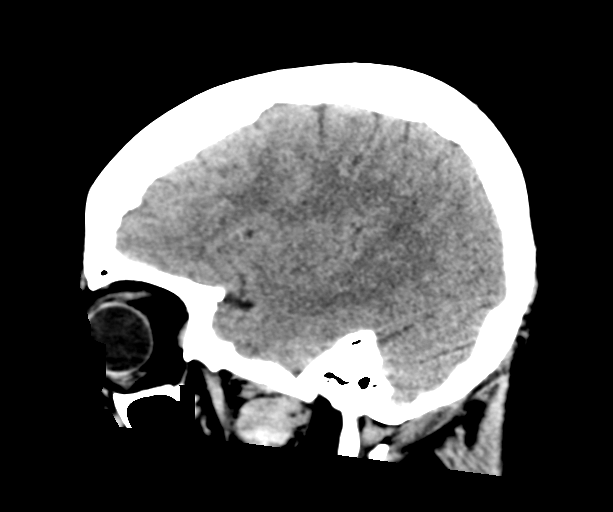
[im 34/67  brain]
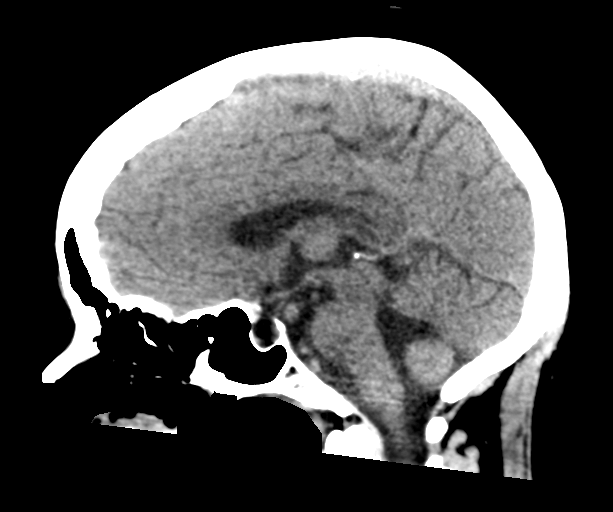
[im 45/67  brain]
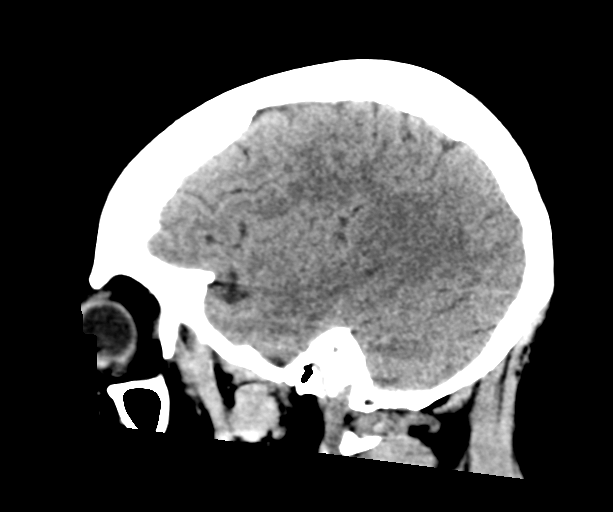

[16 of 47 positions shown; findings below may reference images not displayed]

FINDINGS: Brain: No evidence of acute infarction, hemorrhage, hydrocephalus,
extra-axial collection or mass lesion/mass effect. Signs of chronic
microvascular ischemic change and mild atrophy.

Vascular: No hyperdense vessel or unexpected calcification.

Skull: Normal. Negative for fracture or focal lesion. There is an
area of added density along the anterior sella measuring
approximately 3 mm greatest thickness and 8-9 mm in greatest with.
Best seen on image 34 of series 6.

Sinuses/Orbits: Scattered ethmoid opacification. No air-fluid levels
in the visible sinuses. Orbits are unremarkable.

Other: None.
IMPRESSION: 1. No acute intracranial abnormality.
2. Signs of chronic microvascular ischemic change and mild atrophy.
3. Area of added density along the anterior sella measuring
approximately 3 mm greatest thickness and 8-9 mm in greatest with
image 34 of series 6. This could represent a small tuberculum sella
meningioma. MRI of the brain with and without contrast may be
helpful for further assessment.
4. Scattered ethmoid opacification.

## 2022-07-26 IMAGING — DX DG CHEST 2V
2 series · 2 of 2 positions shown · non-contrast
Comparison: Chest radiograph March 24, 2017

CLINICAL DATA: Left-sided sharp chest pain x1 month

EXAM:
CHEST - 2 VIEW

[chest pa]
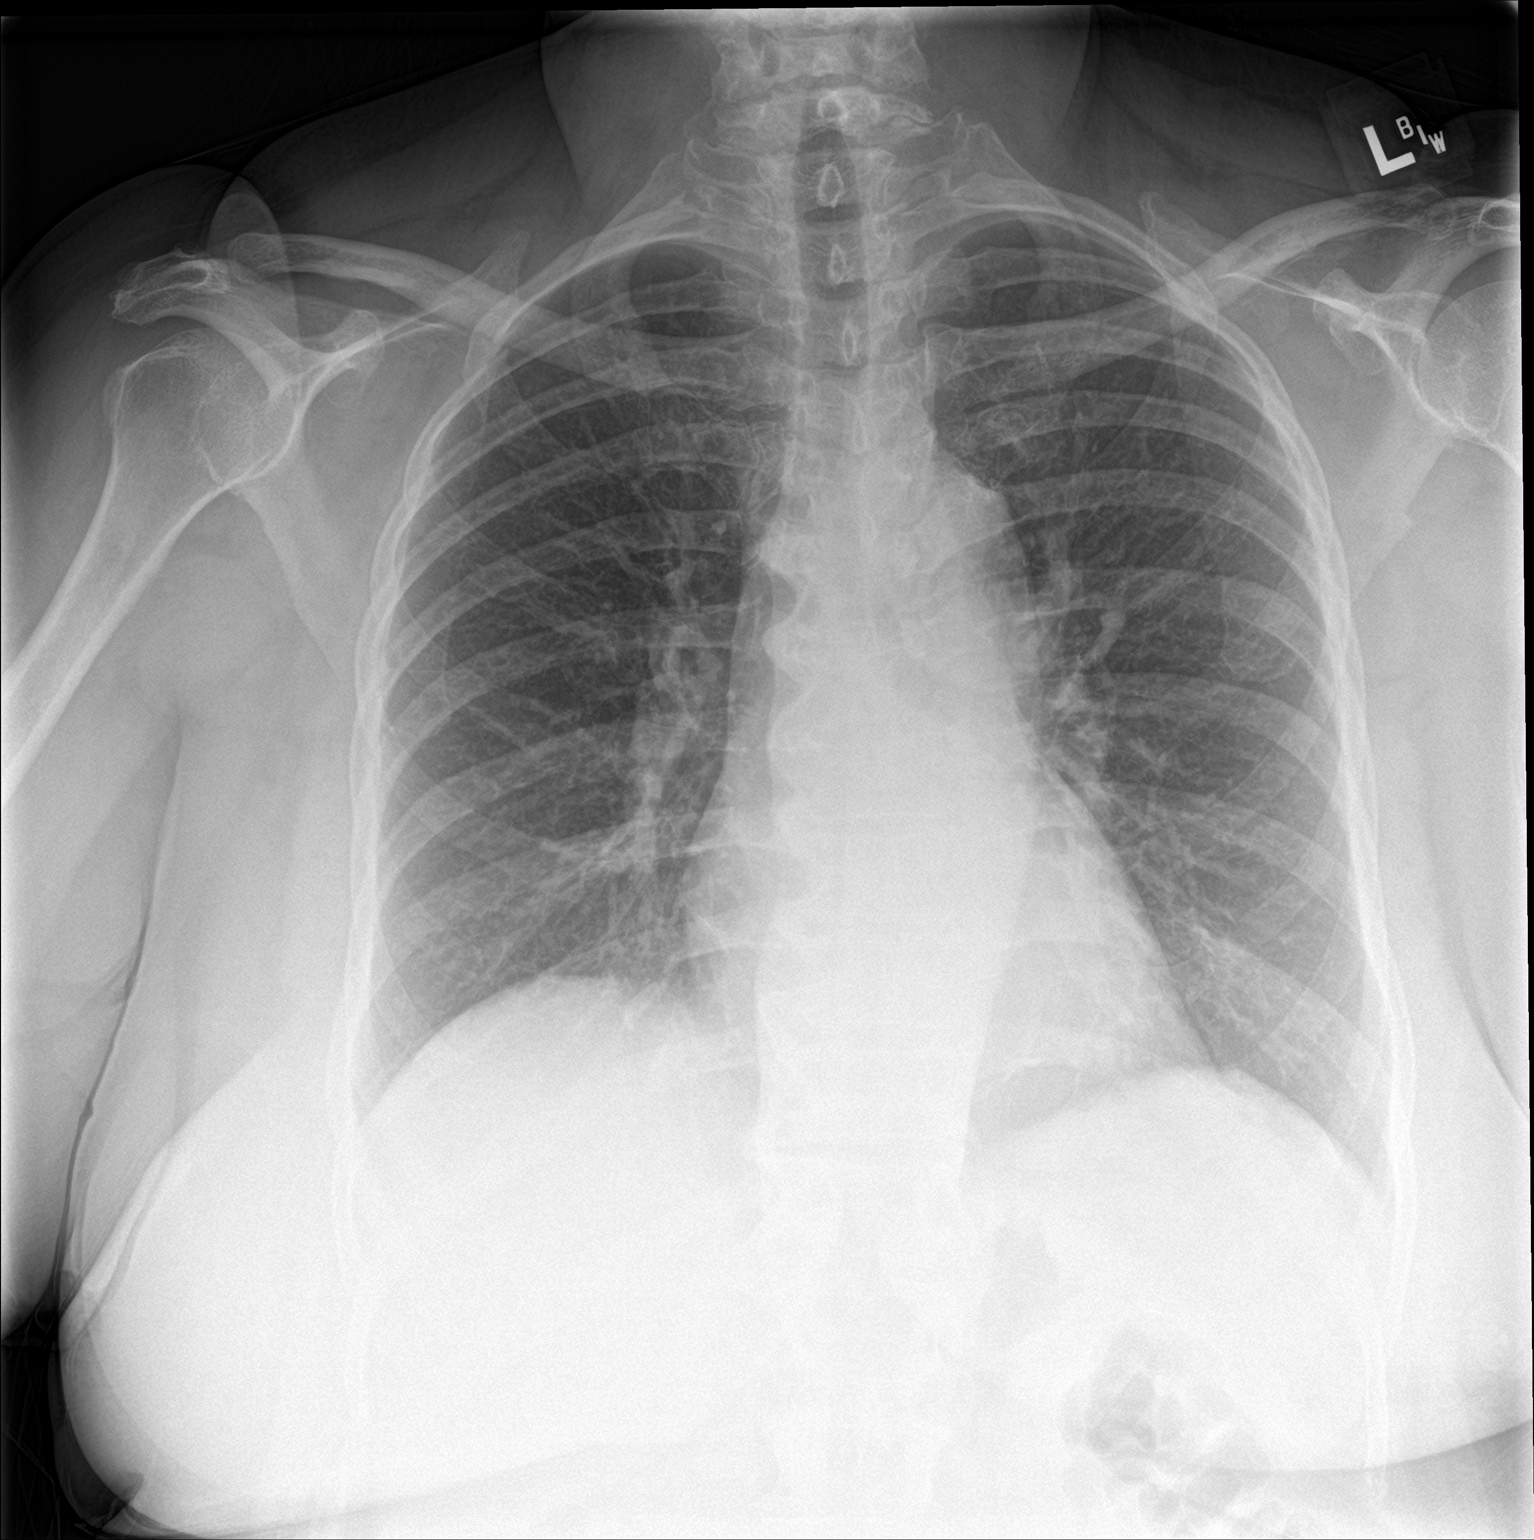

[chest lat]
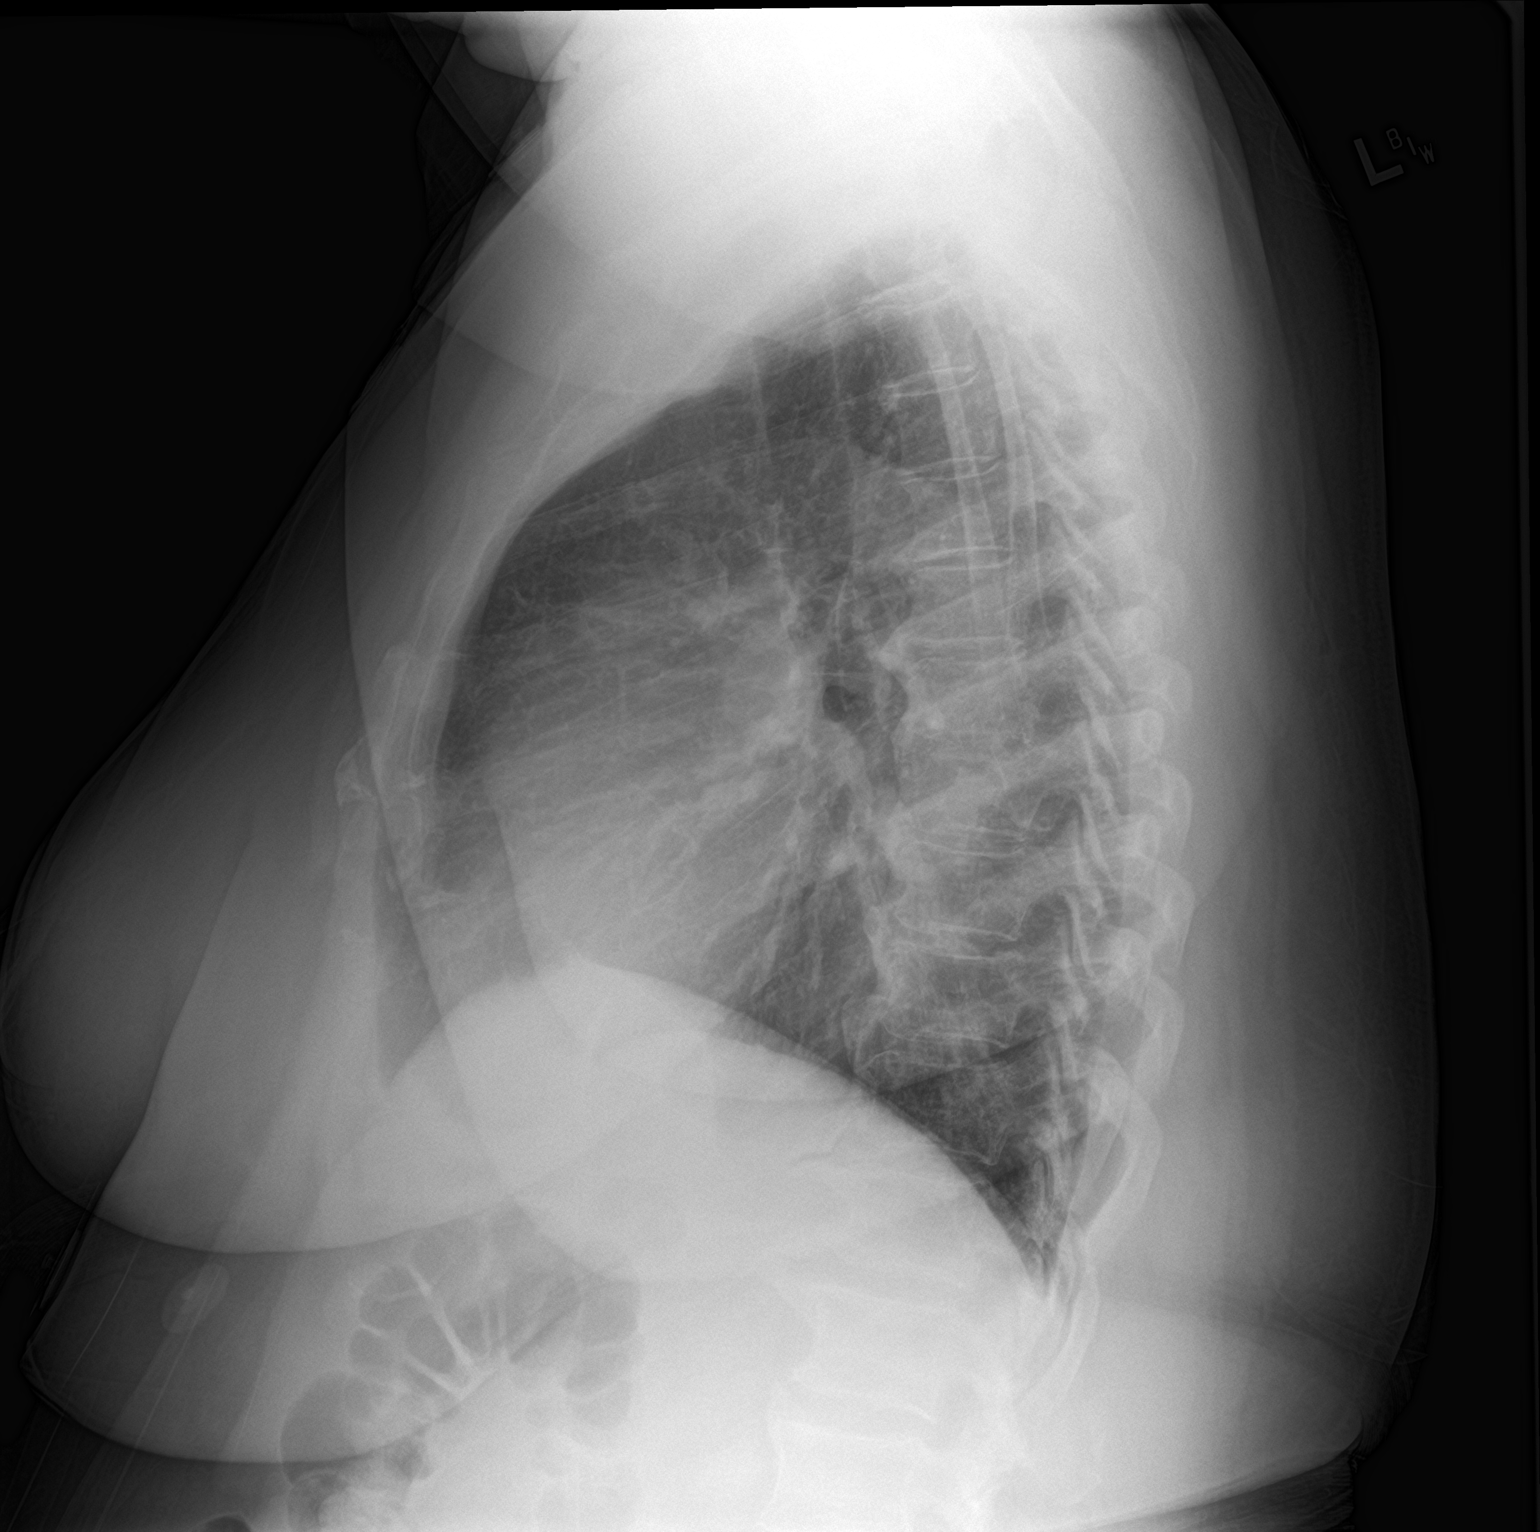

[2 of 2 positions shown; findings below may reference images not displayed]

FINDINGS: The heart size and mediastinal contours are within normal limits.
Both lungs are clear. No visible pneumothorax. No pleural effusion.
Thoracic spondylosis.
IMPRESSION: No active cardiopulmonary disease.

## 2023-03-04 IMAGING — CR DG CHEST 2V
2 series · 2 of 2 positions shown · non-contrast
Comparison: 06/06/2020

CLINICAL DATA: Shortness of breath.

EXAM:
CHEST - 2 VIEW

[chest pa]
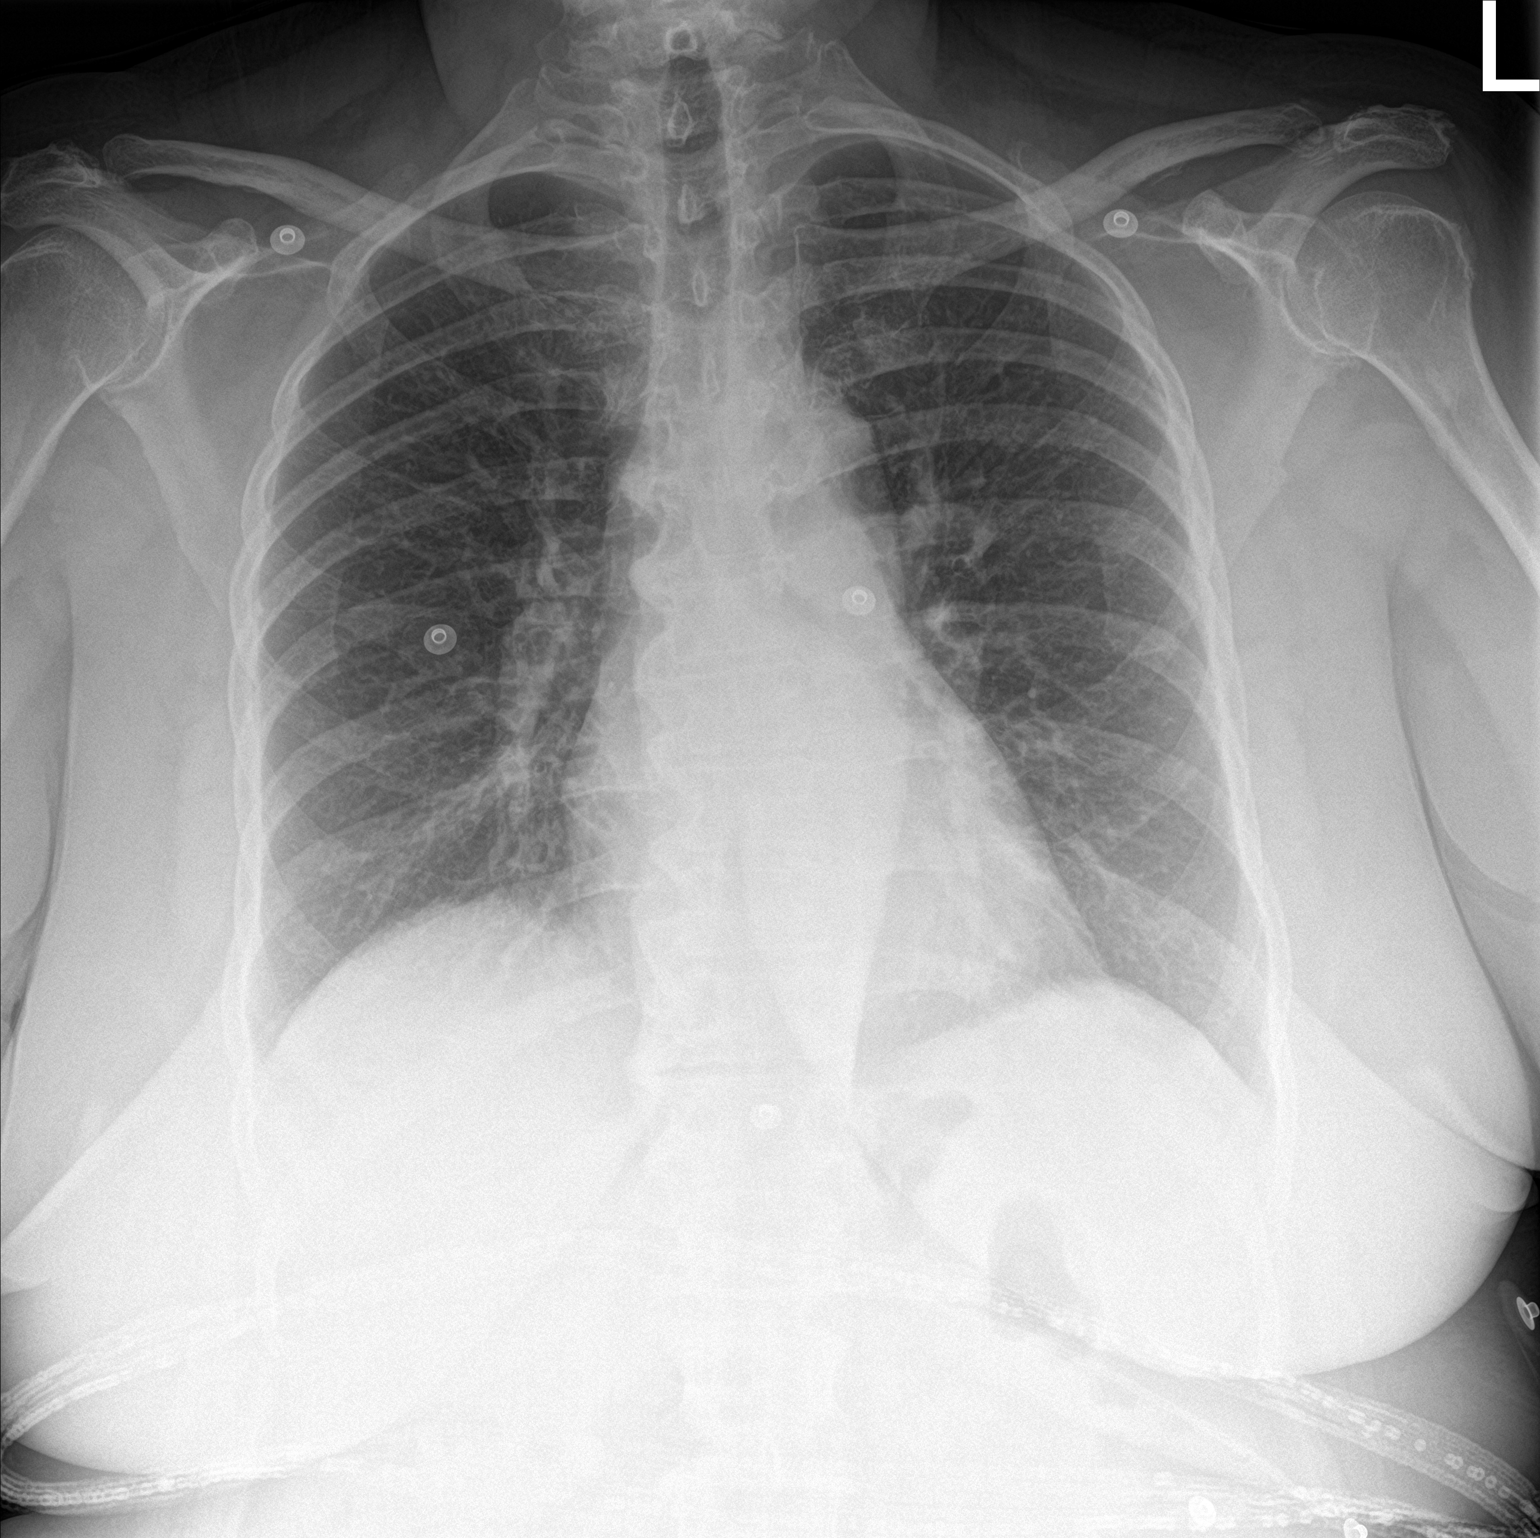

[chest lat]
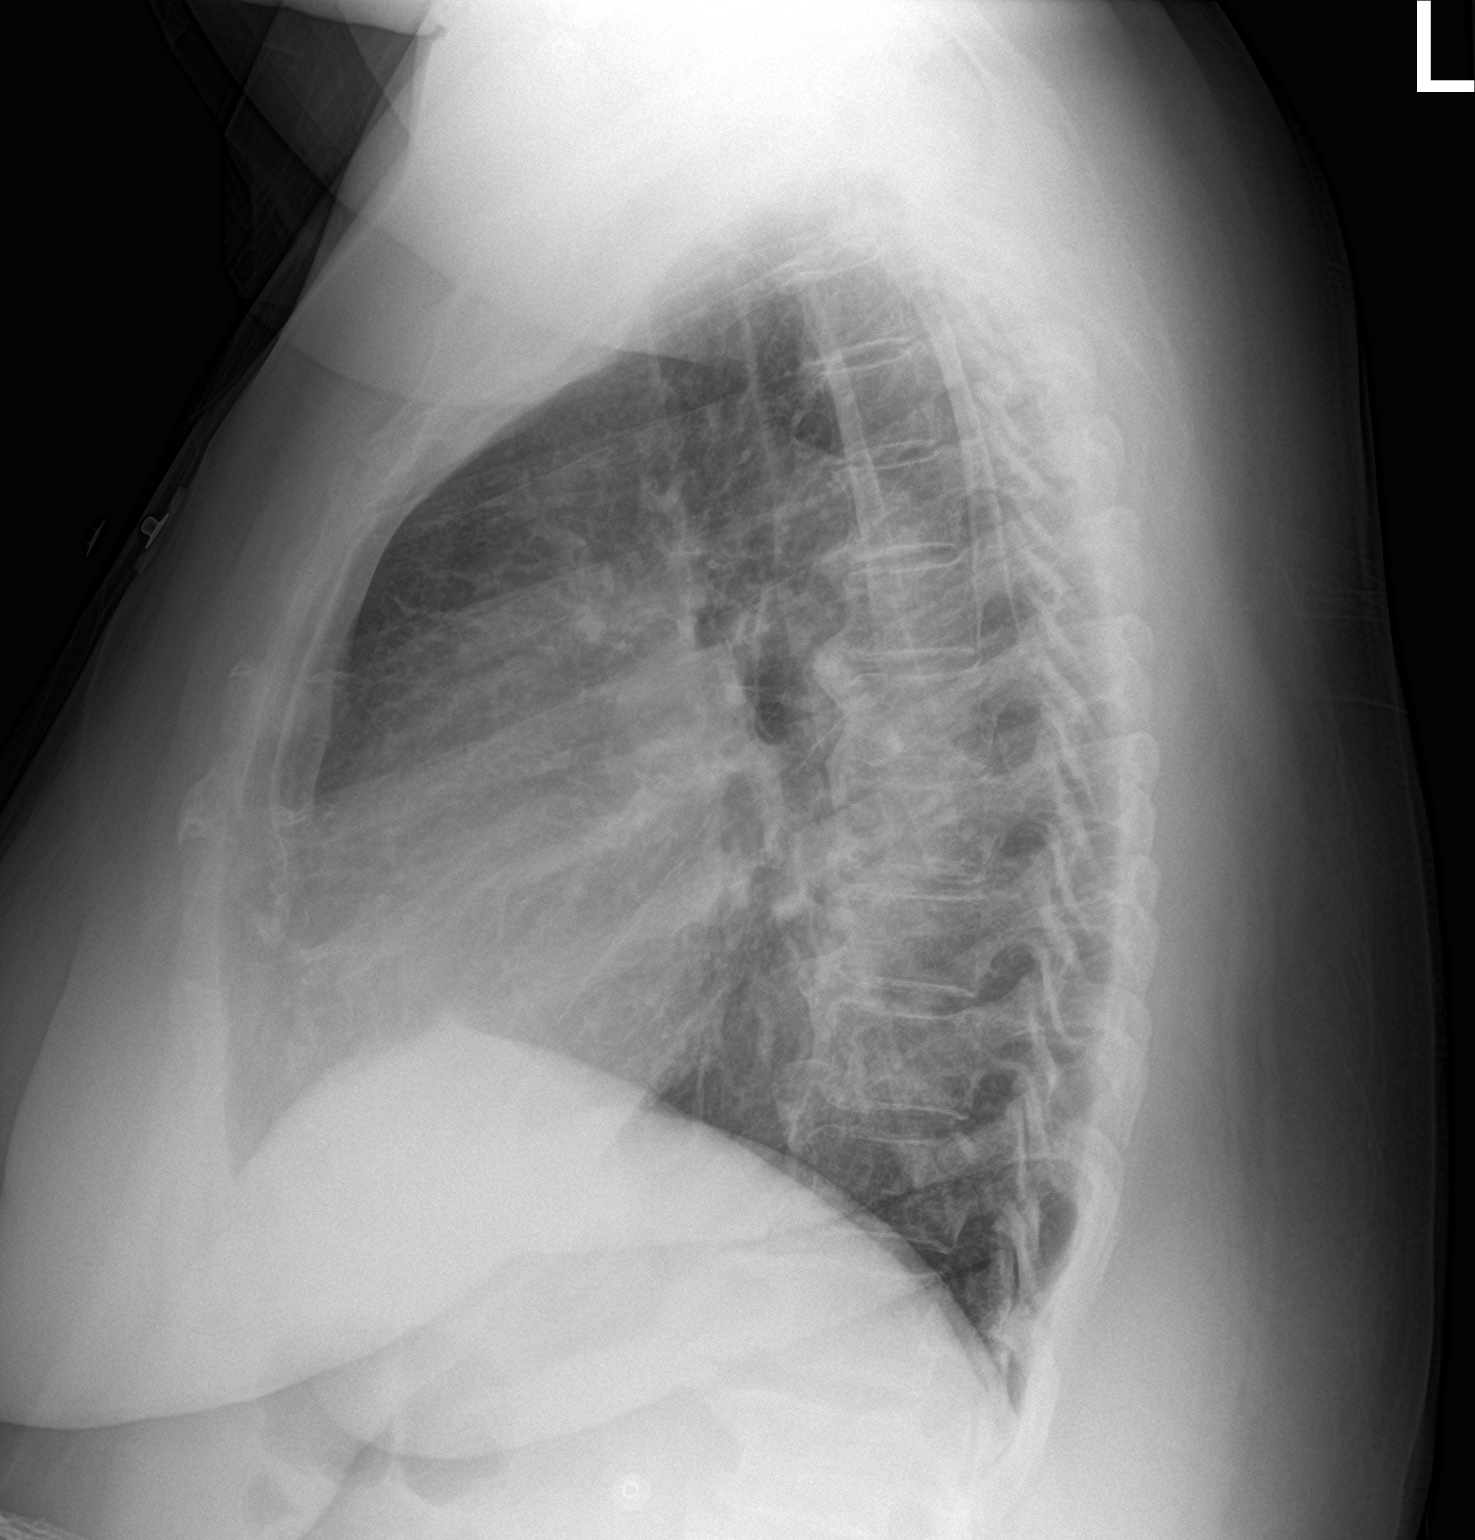

[2 of 2 positions shown; findings below may reference images not displayed]

FINDINGS: Lungs are adequately inflated without evidence of focal airspace
consolidation or effusion. Cardiomediastinal silhouette and
remainder of the exam is unchanged.
IMPRESSION: No active cardiopulmonary disease.
